# Patient Record
Sex: Female | Born: 1985 | Race: Black or African American | Hispanic: No | Smoking: Former smoker
Health system: Southern US, Community
[De-identification: ages and names within clinical notes are randomized; demographics above are authoritative.]

## PROBLEM LIST (undated history)

## (undated) DIAGNOSIS — Z789 Other specified health status: Secondary | ICD-10-CM

## (undated) HISTORY — PX: OTHER SURGICAL HISTORY: SHX169

---

## 2017-11-07 ENCOUNTER — Other Ambulatory Visit: Payer: Self-pay

## 2017-11-07 ENCOUNTER — Encounter: Payer: Self-pay | Admitting: Emergency Medicine

## 2017-11-07 ENCOUNTER — Ambulatory Visit
Admission: EM | Admit: 2017-11-07 | Discharge: 2017-11-07 | Disposition: A | Discharge status: Home / Self Care | Payer: No Typology Code available for payment source | Attending: Family Medicine | Admitting: Family Medicine

## 2017-11-07 DIAGNOSIS — K0889 Other specified disorders of teeth and supporting structures: Secondary | ICD-10-CM

## 2017-11-07 DIAGNOSIS — K047 Periapical abscess without sinus: Secondary | ICD-10-CM

## 2017-11-07 HISTORY — DX: Other specified health status: Z78.9

## 2017-11-07 MED ORDER — NAPROXEN 500 MG PO TABS: 500.0000 mg | ORAL_TABLET | Freq: Two times a day (BID) | ORAL | 0 refills | Status: DC

## 2017-11-07 MED ORDER — CEFTRIAXONE SODIUM 1 G IJ SOLR
1.0000 g | Freq: Once | INTRAMUSCULAR | Status: AC
  Administered 2017-11-07: 1 g via INTRAMUSCULAR

## 2017-11-07 MED ORDER — HYDROCODONE-ACETAMINOPHEN 5-325 MG PO TABS: 1.0000 | ORAL_TABLET | Freq: Four times a day (QID) | ORAL | 0 refills | Status: DC | PRN

## 2017-11-07 MED ORDER — AMOXICILLIN-POT CLAVULANATE 875-125 MG PO TABS: 1.0000 | ORAL_TABLET | Freq: Two times a day (BID) | ORAL | 0 refills | Status: DC

## 2017-11-07 MED ORDER — KETOROLAC TROMETHAMINE 60 MG/2ML IM SOLN
30.0000 mg | Freq: Once | INTRAMUSCULAR | Status: AC
  Administered 2017-11-07: 30 mg via INTRAMUSCULAR

## 2017-11-07 NOTE — Discharge Instructions (Signed)
Apply ice to your jaw he minutes out of every 2 hours 4-5 times daily for swelling and pain.  Arrange an appointment with a dentist for next week.  Her pain recommend one Naprosyn and 500 mg Tylenol taken together 6 hours with food.

## 2017-11-07 NOTE — ED Provider Notes (Signed)
[facility]    CSN: [id number] Arrival date & time: 11/07/17  1816     History   Chief Complaint Chief Complaint  Patient presents with  . tooth abscess    HPI Ashley Joseph is a 31 y.o. female.   HPI  31 year old female who presents with left swollen cheek and jaw that is extremely painful.  Has a fractured tooth on that side been progressively worsening over the last 3 days.  She has had no fever or chills but has a feeling of flu type symptoms.  Recently secured dental insurance and is going to make an appointment with a dentist tomorrow or the first of next week.        Past Medical History:  Diagnosis Date  . No known health problems     There are no active problems to display for this patient.   History reviewed. No pertinent surgical history.  OB History    No data available       Home Medications    Prior to Admission medications   Medication Sig Start Date End Date Taking? Authorizing Provider  amoxicillin-clavulanate (AUGMENTIN) 875-125 MG tablet Take 1 tablet by mouth every 12 (twelve) hours. 11/07/17   Lutricia Feiloemer, Nyah Shepherd P, PA-C  HYDROcodone-acetaminophen (NORCO/VICODIN) 5-325 MG tablet Take 1-2 tablets by mouth every 6 (six) hours as needed. 11/07/17   Lutricia Feiloemer, Shadaya Marschner P, PA-C  naproxen (NAPROSYN) 500 MG tablet Take 1 tablet (500 mg total) by mouth 2 (two) times daily. 11/07/17   Lutricia Feiloemer, Karine Garn P, PA-C    Family History Family History  Problem Relation Age of Onset  . Stroke Mother   . Hypertension Mother   . Hypertension Father   . Diabetes Maternal Aunt     Social History Social History   Tobacco Use  . Smoking status: Former Smoker    Last attempt to quit: 02/2017    Years since quitting: 0.7  . Smokeless tobacco: Never Used  Substance Use Topics  . Alcohol use: Yes    Comment: socially  . Drug use: No     Allergies   Patient has no known allergies.   Review of Systems Review of Systems  Constitutional:  Positive for activity change, appetite change and fatigue. Negative for chills and fever.  HENT: Positive for dental problem.   All other systems reviewed and are negative.    Physical Exam Triage Vital Signs ED Triage Vitals  Enc Vitals Group     BP 11/07/17 1838 109/78     Pulse Rate 11/07/17 1838 88     Resp 11/07/17 1838 16     Temp 11/07/17 1838 98.6 F (37 C)     Temp Source 11/07/17 1838 Oral     SpO2 11/07/17 1838 100 %     Weight 11/07/17 1837 124 lb (56.2 kg)     Height 11/07/17 1837 5\' 5"  (1.651 m)     Head Circumference --      Peak Flow --      Pain Score 11/07/17 1838 6     Pain Loc --      Pain Edu? --      Excl. in GC? --    No data found.  Updated Vital Signs BP 109/78 (BP Location: Right Arm)   Pulse 88   Temp 98.6 F (37 C) (Oral)   Resp 16   Ht 5\' 5"  (1.651 m)   Wt 124 lb (56.2 kg)   LMP 10/15/2017   SpO2 100%  BMI 20.63 kg/m   Visual Acuity Right Eye Distance:   Left Eye Distance:   Bilateral Distance:    Right Eye Near:   Left Eye Near:    Bilateral Near:     Physical Exam  Constitutional: She is oriented to person, place, and time. She appears well-developed and well-nourished. No distress.  HENT:  Head: Normocephalic.  Examination shows facial swelling on the left in the lower jaw area.  Is exquisitely tender externally.  No cervical adenopathy appreciated.  Examination of the gumline over the buccal surface it is very tender underneath a fractured tooth on the left lower jaw.  No fluctuance is seen.  Eyes: Pupils are equal, round, and reactive to light.  Neck: Normal range of motion.  Musculoskeletal: Normal range of motion.  Neurological: She is alert and oriented to person, place, and time.  Skin: Skin is warm and dry. She is not diaphoretic.  Psychiatric: She has a normal mood and affect. Her behavior is normal. Judgment and thought content normal.  Nursing note and vitals reviewed.    UC Treatments / Results  Labs (all  labs ordered are listed, but only abnormal results are displayed) Labs Reviewed - No data to display  EKG  EKG Interpretation None       Radiology No results found.  Procedures Procedures (including critical care time)  Medications Ordered in UC Medications  ketorolac (TORADOL) injection 30 mg (30 mg Intramuscular Given 11/07/17 1910)  cefTRIAXone (ROCEPHIN) injection 1 g (1 g Intramuscular Given 11/07/17 1909)     Initial Impression / Assessment and Plan / UC Course  I have reviewed the triage vital signs and the nursing notes.  Pertinent labs & imaging results that were available during my care of the patient were reviewed by me and considered in my medical decision making (see chart for details).     Plan: 1. Test/x-ray results and diagnosis reviewed with patient 2. rx as per orders; risks, benefits, potential side effects reviewed with patient 3. Recommend supportive treatment with ice applied to the cheek 20 minutes out of every 2 hours 4-5 times daily for swelling and pain.  Prescribe a few Vicodin because the patient is in a great deal of pain.  [place] substance abuse registry was accessed with no findings of any current opioid use.  Because of the amount of pain and swelling I did give her Rocephin 1 g IM today along with 30 mg of Toradol for pain.  She will begin the Augmentin tonight and complete the course of therapy.  She will also make an appointment with a dentist either tomorrow or on Monday for referral as soon as possible 4. F/u prn if symptoms worsen or don't improve   Final Clinical Impressions(s) / UC Diagnoses   Final diagnoses:  Abscessed tooth    ED Discharge Orders        Ordered    amoxicillin-clavulanate (AUGMENTIN) 875-125 MG tablet  Every 12 hours     11/07/17 1902    HYDROcodone-acetaminophen (NORCO/VICODIN) 5-325 MG tablet  Every 6 hours PRN     11/07/17 1902    naproxen (NAPROSYN) 500 MG tablet  2 times daily     11/07/17 1902         Controlled Substance Prescriptions [place] Controlled Substance Registry consulted? With [place] substance abuse registry was accessed no findings of her recent use of opioids.   Lutricia Feil, PA-C 11/07/17 1950

## 2017-11-07 NOTE — ED Triage Notes (Signed)
Patient in today c/o left sided facial swelling and pain from an abscessed tooth.

## 2017-12-04 ENCOUNTER — Telehealth: Payer: Self-pay

## 2017-12-04 NOTE — Telephone Encounter (Signed)
Asher MuirJamie from Rehab is calling, patient working on pre employment physical Patient had an abnormal EKG and prolonged QT syndrome Set up new patient appt 2/5 with Dr Mariah MillingGollan  Needs sooner, added to waitlist

## 2017-12-06 ENCOUNTER — Ambulatory Visit: Payer: Self-pay | Admitting: Medical

## 2017-12-06 ENCOUNTER — Encounter: Payer: Self-pay | Admitting: Medical

## 2017-12-06 DIAGNOSIS — M25562 Pain in left knee: Secondary | ICD-10-CM

## 2017-12-06 DIAGNOSIS — J019 Acute sinusitis, unspecified: Secondary | ICD-10-CM

## 2017-12-06 DIAGNOSIS — G8929 Other chronic pain: Secondary | ICD-10-CM

## 2017-12-06 NOTE — Progress Notes (Signed)
Subjective:    Patient ID: Ashley Joseph, female    DOB: Aug 11, 1986, 32 y.o.   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 Did check that she gets short of breath going up stairs to the 3rd floor, she lives on the 3rd floor. Also was exercising daily with play station dance doing 1-2 hours, would get short of breath after 3rd dance , but continued dancing. Since talking to Dr. Deforest Hoyles she has not exercised. Pending Cardiology appointment EKG showed non specific changes and prolonged QT scheduled  per Dr. Deforest Hoyles pending appointment on January 15th 2019. Received Hep B vaccine #1 today. VIS sheet given to patient.  Also history of  Left medial knee pain. History of fall in 11th grade x-rays done  denies any fracture per patient.Placed in knee immobilizer x 1 month. Five-six months ago had a bruise on medial side of left knee seen at [facility] Ultrasound done, patient said it was "fine". Sometimes gives her pain going up and down stairs.  Also has had yellow drainage starting today. History of post nasal drip. No primary doctor at this time, says last labs were 2 years ago and was told she was "fine". Moved from [place] in June 2018.  Denies any medical history including anemia . Denies any surgeries.   Social Married no children. Former smoker quit 2017 used to smoke 5 cigarettes/day x 5 years. Wears glasses all the time last eye exam 2017.   Review of Systems  Constitutional: Negative for chills and fever.  HENT: Positive for congestion, postnasal drip and sinus pressure. Negative for ear pain and sore throat.   Eyes: Negative for discharge and itching.  Respiratory: Negative for cough. Shortness of breath: see narrative.   Cardiovascular: Negative for chest pain and leg swelling.  Gastrointestinal: Negative for abdominal pain.  Endocrine: Negative for polydipsia, polyphagia and polyuria.   Genitourinary: Negative for dysuria.  Musculoskeletal: Negative for back pain.  Skin: Negative for rash.  Neurological: Positive for dizziness (2 weeks ago while dancing, sat down lasted only a minute, drank water and it resolved.) and headaches (forehead). Negative for syncope.  Hematological: Positive for adenopathy (prior to  having tooth pulled left lower about initially with abcessed tooth. Finished antibiotics 3 days ago.).       Objective:   Physical Exam  Constitutional: She is oriented to person, place, and time. She appears well-developed and well-nourished.  HENT:  Head: Normocephalic and atraumatic.  Right Ear: Hearing, external ear and ear canal normal.  Left Ear: Hearing, external ear and ear canal normal.  Mouth/Throat: Uvula is midline, oropharynx is clear and moist and mucous membranes are normal.  Eyes: Conjunctivae, EOM and lids are normal. Pupils are equal, round, and reactive to light.  Fundoscopic exam:      The right eye shows red reflex.       The left eye shows red reflex.  Neck: Normal range of motion. Neck supple.  Left submandibular  Cardiovascular: Normal rate, regular rhythm and intact distal pulses. Exam reveals friction rub. Exam reveals no gallop.  No murmur heard. Pulmonary/Chest: Effort normal and breath sounds normal. No respiratory distress. She has no wheezes. She has no rales.  Abdominal: Soft. Bowel sounds are normal. She exhibits no distension and no mass. There is no tenderness. There is no rebound and no guarding.  Musculoskeletal: Normal range of motion.  Lymphadenopathy:    She has cervical  adenopathy.  Neurological: She is alert and oriented to person, place, and time. She has normal reflexes.  Skin: Skin is warm and dry.  Psychiatric: She has a normal mood and affect. Her behavior is normal. Judgment and thought content normal.  Nursing note and vitals reviewed.  Cerumen obscuring both TMs. Inside right nare bloody lateral side of  right nare, green discharge noted and erythema. Inside left nare erythema. Left submandibular mild adenopathy noted. Pain on medial drawers test on the left knee and mild on lateral . None posterior or anterior. No pain on right knee.  Petite     Assessment & Plan:  Sinusitis Left knee pain.[facility]commended [facility] evaluation and clearance for left knee pain / sinusitis. Cardiology referral per Dr. Deforest Hoylesaubs January 15th. History of Shortness of breath. Abnormal labs will review with Dr. Sullivan LoneGilbert.  To get clearance letters before continuing [employer] paperwork for position. Discussed lab work with Dr. Sullivan LoneGilbert mild anemia to follow up with a family doctor.

## 2017-12-07 NOTE — Progress Notes (Signed)
Cardiology Office Note  Date:  12/10/2017   ID:  Ashley Joseph, DOB 20-Feb-1986, MRN [id number]  PCP:  Herbert SetaHeather Radcliffe/Dr. Adriana Simasook  Chief Complaint  Patient presents with  . other    Consultation for ABN EKG with prolonged QT. Meds reviewed verbally with pt.    HPI:  Ashley Joseph is a 32 year old woman with past medical history of Former smoker "elevated cholesterol" patient working on pre employment physical Who presents by referral from [employer] Radcliffe for  abnormal EKG and prolonged QT syndrome  She reports having preemployment physical  EKG with abnormality, prolonged QT  EKGs not available for review   5 days of sinus discharge Recent tooth pulled, completed her amoxicillin Nares are somewhat congested   reports having some mild shortness of breath with climbing flights of stairs Also mild shortness of breath when she does wii "just dance" after several songs Sits down to recover with no significant problem  Otherwise able to walk on flat surfaces for prolonged periods of time with no shortness of breath or chest pressure  EKG personally reviewed by myself on todays visit Shows normal sinus rhythm with rate 76 beats per minute no significant ST or T-wave changes, normal QTC 434   PMH:  Prior history of smoking, stopped one year ago  PSH:    Past Surgical History:  Procedure Laterality Date  . none      No current outpatient medications on file.   No current facility-administered medications for this visit.      Allergies:   Patient has no known allergies.   Social History:  The patient  reports that she quit smoking about 9 months ago. She quit after 6.00 years of use. she has never used smokeless tobacco. She reports that she drinks alcohol. She reports that she does not use drugs.   Family History:   family history includes Congestive Heart Failure in her maternal grandmother and mother; Diabetes in her maternal aunt; Heart failure in her mother;  Hypertension in her father and mother; Stroke in her maternal grandmother and mother.    Review of Systems: Review of Systems  Constitutional: Negative.   Respiratory: Negative.   Cardiovascular: Negative.   Gastrointestinal: Negative.   Musculoskeletal: Negative.   Neurological: Negative.   Psychiatric/Behavioral: Negative.   All other systems reviewed and are negative.    PHYSICAL EXAM: VS:  BP 120/84 (BP Location: Right Arm, Patient Position: Sitting, Cuff Size: Normal)   Pulse 76   Ht 5\' 5"  (1.651 m)   Wt 131 lb 4 oz (59.5 kg)   BMI 21.84 kg/m  , BMI Body mass index is 21.84 kg/m. GEN: Well nourished, well developed, in no acute distress  HEENT: normal  Neck: no JVD, carotid bruits, or masses Cardiac: RRR; no murmurs, rubs, or gallops,no edema  Respiratory:  clear to auscultation bilaterally, normal work of breathing GI: soft, nontender, nondistended, + BS MS: no deformity or atrophy  Skin: warm and dry, no rash Neuro:  Strength and sensation are intact Psych: euthymic mood, full affect    Recent Labs: No results found for requested labs within last 8760 hours.    Lipid Panel No results found for: CHOL, HDL, LDLCALC, TRIG    Wt Readings from Last 3 Encounters:  12/10/17 131 lb 4 oz (59.5 kg)  11/07/17 124 lb (56.2 kg)       ASSESSMENT AND PLAN:  Abnormal EKG - Plan: EKG 12-Lead Repeat EKG on today's visit is normal with no abnormalities, normal  QT Normal clinical exam, no further workup needed  Viral sinusitis Suspect viral sinus congestion She has completed amoxicillin after recent tooth extraction Does not need additional antibiotics at this time   Encounter for preventive care Reports having elevated cholesterol but numbers unavailable No immediate need for medical management at this time given her age  Prior history of smoking Stopped one year ago Continue smoking cessation recommended  Disposition:   F/U as needed    patient was seen  in consultation for Benjamin Stain will be referred back to her office for ongoing care of the issues detailed above   Total encounter time more than 60 minutes  Greater than 50% was spent in counseling and coordination of care with the patient   Orders Placed This Encounter  Procedures  . EKG 12-Lead     Signed, Dossie Arbour, M.D., Ph.D. 12/10/2017  [facility] [place], [place] [phone]

## 2017-12-10 ENCOUNTER — Encounter: Payer: Self-pay | Admitting: Cardiovascular Disease

## 2017-12-10 ENCOUNTER — Ambulatory Visit (INDEPENDENT_AMBULATORY_CARE_PROVIDER_SITE_OTHER): Payer: No Typology Code available for payment source | Admitting: Cardiovascular Disease

## 2017-12-10 ENCOUNTER — Encounter: Payer: Self-pay | Admitting: *Deleted

## 2017-12-10 VITALS — BP 120/84 | HR 76 | Ht 65.0 in | Wt 131.2 lb

## 2017-12-10 DIAGNOSIS — J329 Chronic sinusitis, unspecified: Secondary | ICD-10-CM | POA: Diagnosis not present

## 2017-12-10 DIAGNOSIS — B9789 Other viral agents as the cause of diseases classified elsewhere: Secondary | ICD-10-CM | POA: Diagnosis not present

## 2017-12-10 DIAGNOSIS — R9431 Abnormal electrocardiogram [ECG] [EKG]: Secondary | ICD-10-CM

## 2017-12-10 DIAGNOSIS — Z0000 Encounter for general adult medical examination without abnormal findings: Secondary | ICD-10-CM | POA: Diagnosis not present

## 2017-12-10 DIAGNOSIS — Z87891 Personal history of nicotine dependence: Secondary | ICD-10-CM

## 2017-12-10 NOTE — Patient Instructions (Signed)
Medication Instructions:   No medication changes made  Labwork:  No new labs needed  Testing/Procedures:  No further testing at this time   Follow-Up: It was a pleasure seeing you in the office today. Please call us if you have new issues that need to be addressed before your next appt.  [phone]  Your physician wants you to follow-up in:  As needed  If you need a refill on your cardiac medications before your next appointment, please call your pharmacy.     

## 2017-12-11 ENCOUNTER — Encounter: Payer: Self-pay | Admitting: Cardiovascular Disease

## 2017-12-25 ENCOUNTER — Ambulatory Visit
Admission: EM | Admit: 2017-12-25 | Discharge: 2017-12-25 | Disposition: A | Discharge status: Home / Self Care | Payer: No Typology Code available for payment source | Attending: Family Medicine | Admitting: Family Medicine

## 2017-12-25 ENCOUNTER — Other Ambulatory Visit: Payer: Self-pay

## 2017-12-25 DIAGNOSIS — J069 Acute upper respiratory infection, unspecified: Secondary | ICD-10-CM

## 2017-12-25 DIAGNOSIS — R05 Cough: Secondary | ICD-10-CM | POA: Diagnosis not present

## 2017-12-25 DIAGNOSIS — B9789 Other viral agents as the cause of diseases classified elsewhere: Secondary | ICD-10-CM | POA: Diagnosis not present

## 2017-12-25 DIAGNOSIS — T161XXA Foreign body in right ear, initial encounter: Secondary | ICD-10-CM

## 2017-12-25 MED ORDER — FLUTICASONE PROPIONATE 50 MCG/ACT NA SUSP: 1.0000 | Freq: Every day | NASAL | 0 refills | Status: DC

## 2017-12-25 MED ORDER — BENZONATATE 100 MG PO CAPS: 100.0000 mg | ORAL_CAPSULE | Freq: Three times a day (TID) | ORAL | 0 refills | Status: DC | PRN

## 2017-12-25 MED ORDER — HYDROCOD POLST-CPM POLST ER 10-8 MG/5ML PO SUER: 5.0000 mL | Freq: Every evening | ORAL | 0 refills | Status: DC | PRN

## 2017-12-25 NOTE — Discharge Instructions (Signed)
Take medication as prescribed. Rest. Drink plenty of fluids. Avoid ear plugs as discussed.   Follow up with your primary care physician this week as needed. Return to Urgent care for new or worsening concerns.

## 2017-12-25 NOTE — ED Triage Notes (Signed)
Pt reports part of an earplug broke off in her right ear today. Mild discomfort. Also complains of a cough.

## 2017-12-25 NOTE — ED Provider Notes (Signed)
[facility] ____________________________________________  Time seen: Approximately 653 PM  I have reviewed the triage vital signs and the nursing notes.   HISTORY  Chief Complaint Foreign Body in Ear  HPI Ashley HeinzJasmine Joseph is a 32 y.o. female presenting for evaluation of removal of broken piece of ear plug in her right ear.  Patient states that she sleeps in earplugs due to her husband's snoring, and states that part of her ear plug broke off in her right ear today.  States mild discomfort to the right ear.  Also reports she has had 3-4 days of runny nose, nasal congestion and cough.  Denies sore throat, fevers, chest pain, shortness of breath, recent sickness.  Reports her husband recently sick with some similar complaints.  States did take some over-the-counter congestion medication this morning with much change.  States cough disrupt her sleep today.  Denies accompanying fevers.  Reports otherwise feels well.  States that she tried to use water to flush out right ear plugged, unsuccessfully. Denies recent sickness. Denies recent antibiotic use.   Patient's last menstrual period was 11/28/2017.Denies pregnancy.    Past Medical History:  Diagnosis Date  . No known health problems     Patient Active Problem List   Diagnosis Date Noted  . Abnormal EKG 12/10/2017  . Viral sinusitis 12/10/2017  . Encounter for preventive care 12/10/2017  . History of smoking 12/10/2017    Past Surgical History:  Procedure Laterality Date  . none       No current facility-administered medications for this encounter.   Current Outpatient Medications:  .  benzonatate (TESSALON PERLES) 100 MG capsule, Take 1 capsule (100 mg total) by mouth 3 (three) times daily as needed for cough., Disp: 15 capsule, Rfl: 0 .  chlorpheniramine-HYDROcodone (TUSSIONEX PENNKINETIC ER) 10-8 MG/5ML SUER, Take 5 mLs by mouth at bedtime as needed for cough. do not drive or operate machinery while taking as can  cause drowsiness., Disp: 50 mL, Rfl: 0 .  fluticasone (FLONASE) 50 MCG/ACT nasal spray, Place 1 spray into both nostrils daily., Disp: 1 g, Rfl: 0  Allergies Patient has no known allergies.  Family History  Problem Relation Age of Onset  . Stroke Mother   . Hypertension Mother   . Congestive Heart Failure Mother   . Heart failure Mother   . Hypertension Father   . Diabetes Maternal Aunt   . Congestive Heart Failure Maternal Grandmother   . Stroke Maternal Grandmother     Social History Social History   Tobacco Use  . Smoking status: Former Smoker    Years: 6.00    Types: Cigars    Last attempt to quit: 02/2017    Years since quitting: 0.8  . Smokeless tobacco: Never Used  . Tobacco comment: Black mild 5-6 per day  Substance Use Topics  . Alcohol use: Yes    Comment: socially  . Drug use: No    Review of Systems Constitutional: No fever/chills Eyes: No visual changes. ENT: No sore throat.As above.  Cardiovascular: Denies chest pain. Respiratory: Denies shortness of breath. Gastrointestinal: No abdominal pain.  Skin: Negative for rash.  ____________________________________________   PHYSICAL EXAM:  VITAL SIGNS: ED Triage Vitals  Enc Vitals Group     BP 12/25/17 1810 115/77     Pulse Rate 12/25/17 1810 87     Resp 12/25/17 1810 18     Temp 12/25/17 1810 98.8 F (37.1 C)     Temp Source 12/25/17 1810 Oral  SpO2 12/25/17 1810 100 %     Weight 12/25/17 1811 132 lb (59.9 kg)     Height 12/25/17 1811 5\' 5"  (1.651 m)     Head Circumference --      Peak Flow --      Pain Score 12/25/17 1811 1     Pain Loc --      Pain Edu? --      Excl. in GC? --     Constitutional: Alert and oriented. Well appearing and in no acute distress. Eyes: Conjunctivae are normal.  Head: Atraumatic. No sinus tenderness to palpation. No swelling. No erythema.  Ears: Left: Nontender, mild cerumen present in canal, otherwise normal canal, no erythema, normal TM.  Right:  Nontender, bright yellow foreign body present in ear canal, post foreign body removal, canal with minimal inflammation, mild cerumen present, no erythema normal TM.  Nose:Nasal congestion with clear rhinorrhea  Mouth/Throat: Mucous membranes are moist. No pharyngeal erythema. No tonsillar swelling or exudate.  Neck: No stridor.  No cervical spine tenderness to palpation. Hematological/Lymphatic/Immunilogical: No cervical lymphadenopathy. Cardiovascular: Normal rate, regular rhythm. Grossly normal heart sounds.  Good peripheral circulation. Respiratory: Normal respiratory effort.  No retractions. No wheezes, rales or rhonchi. Good air movement.  Dry intermittent cough noted in room. Musculoskeletal: Ambulatory with steady gait. No cervical, thoracic or lumbar tenderness to palpation. Neurologic:  Normal speech and language. No gait instability. Skin:  Skin appears warm, dry and intact. No rash noted. Psychiatric: Mood and affect are normal. Speech and behavior are normal.  ___________________________________________   LABS (all labs ordered are listed, but only abnormal results are displayed)  Labs Reviewed - No data to display ____________________________________________   PROCEDURES Procedures    Right ear foreign body removal. Procedure explained and verbal consent obtained. Alligator forceps used to secure withdrawal foreign body present.  Patient tolerated well.  Foreign body removed.  INITIAL IMPRESSION / ASSESSMENT AND PLAN / ED COURSE  Pertinent labs & imaging results that were available during my care of the patient were reviewed by me and considered in my medical decision making (see chart for details).  Well-appearing patient.  No acute distress.  Right ear foreign body removed, patient tolerated well.  Suspect viral upper respiratory infection.  Discussed supportive care.  Flonase, PRN Tessalon Perles and as needed Tussionex.  Discussed over-the-counter Sudafed.   Encouraged to not use earplugs at this time.  Encouraged rest, fluids, supportive care.Discussed indication, risks and benefits of medications with patient.  Discussed follow up with Primary care physician this week. Discussed follow up and return parameters including no resolution or any worsening concerns. Patient verbalized understanding and agreed to plan.   ____________________________________________   FINAL CLINICAL IMPRESSION(S) / ED DIAGNOSES  Final diagnoses:  Acute foreign body of right ear, initial encounter  Viral URI with cough     ED Discharge Orders        Ordered    benzonatate (TESSALON PERLES) 100 MG capsule  3 times daily PRN     12/25/17 1909    chlorpheniramine-HYDROcodone (TUSSIONEX PENNKINETIC ER) 10-8 MG/5ML SUER  At bedtime PRN     12/25/17 1909    fluticasone (FLONASE) 50 MCG/ACT nasal spray  Daily     12/25/17 1909       Note: This dictation was prepared with Dragon dictation along with smaller phrase technology. Any transcriptional errors that result from this process are unintentional.         Renford Dills, NP 12/25/17 2100

## 2017-12-28 ENCOUNTER — Telehealth: Payer: Self-pay

## 2017-12-28 NOTE — Telephone Encounter (Signed)
Called to follow up with patient since visit here at [facility]. Patient instructed to call back with any questions or concerns. MAH  

## 2017-12-31 ENCOUNTER — Ambulatory Visit: Payer: Self-pay | Admitting: Cardiovascular Disease

## 2018-01-02 ENCOUNTER — Other Ambulatory Visit: Payer: Self-pay

## 2018-01-02 ENCOUNTER — Ambulatory Visit (INDEPENDENT_AMBULATORY_CARE_PROVIDER_SITE_OTHER): Payer: No Typology Code available for payment source | Admitting: Nurse Practitioner

## 2018-01-02 ENCOUNTER — Encounter: Payer: Self-pay | Admitting: Nurse Practitioner

## 2018-01-02 VITALS — BP 105/67 | HR 79 | Temp 99.1°F | Resp 16 | Ht 65.0 in | Wt 134.0 lb

## 2018-01-02 DIAGNOSIS — J208 Acute bronchitis due to other specified organisms: Secondary | ICD-10-CM

## 2018-01-02 DIAGNOSIS — K625 Hemorrhage of anus and rectum: Secondary | ICD-10-CM

## 2018-01-02 DIAGNOSIS — B9789 Other viral agents as the cause of diseases classified elsewhere: Secondary | ICD-10-CM

## 2018-01-02 DIAGNOSIS — Z7689 Persons encountering health services in other specified circumstances: Secondary | ICD-10-CM

## 2018-01-02 DIAGNOSIS — J329 Chronic sinusitis, unspecified: Secondary | ICD-10-CM

## 2018-01-02 DIAGNOSIS — M25562 Pain in left knee: Secondary | ICD-10-CM

## 2018-01-02 DIAGNOSIS — G8929 Other chronic pain: Secondary | ICD-10-CM | POA: Insufficient documentation

## 2018-01-02 NOTE — Progress Notes (Signed)
Subjective:    Patient ID: Ashley HeinzJasmine Joseph, female    DOB: 08-09-1986, 32 y.o.   MRN: [id number]  Ashley Joseph is a 32 y.o. female presenting on 01/02/2018 for Establish Care (difficulty hearing out of right ear. The pt was seen at the urgent care x 1 week ago, because she had a piece of a ear plug stuck in her R ear. )   HPI Establish Care New Provider Pt last seen by PCP/pediatrician - Dr. Signe Coltover [place], [place] [facility] 2-3 years ago.  [facility] and Dr. Kearney Hardover for immunizations.   Right ear: decreased hearing, pain Ear plug stuck in ear 1 week ago and pt reports continuing to have pain since then.  Pt has also had a sinus infection and was on amoxicillin for tooth abscess, so Urgent Care didn't add any other antibiotics.  After extraction of bottom left mouth started having symptoms of URI 2-3 days later.  Tooth extraction occurred 14 days ago with URI symptoms x 10 days.  Rectal bleeding:  - Notes when wipes on tissue after defecation.  Much less now than 2 weeks ago.  No BM are normal.  3 days ago, BM began to have normal consistency.  Did have blood in toilet water 2 weeks ago, but notes no blood in toilet water now.  Now, pt notes her stool is light orange color and no maroon/black.  Denies abdominal pain, rectal pain.  Past Medical History:  Diagnosis Date  . No known health problems    Past Surgical History:  Procedure Laterality Date  . none     Social History   Socioeconomic History  . Marital status: Married    Spouse name: Not on file  . Number of children: 0  . Years of education: Not on file  . Highest education level: High school graduate  Social Needs  . Financial resource strain: Not on file  . Food insecurity - worry: Not on file  . Food insecurity - inability: Not on file  . Transportation needs - medical: Not on file  . Transportation needs - non-medical: Not on file  Occupational History  . Not on file  Tobacco  Use  . Smoking status: Former Smoker    Years: 6.00    Types: Cigars    Last attempt to quit: 02/2017    Years since quitting: 0.9  . Smokeless tobacco: Never Used  . Tobacco comment: Black mild 5-6 per day  Substance and Sexual Activity  . Alcohol use: Yes    Comment: socially  . Drug use: No  . Sexual activity: Yes    Birth control/protection: None  Other Topics Concern  . Not on file  Social History Narrative  . Not on file   Family History  Problem Relation Age of Onset  . Stroke Mother   . Hypertension Mother   . Congestive Heart Failure Mother   . Heart failure Mother   . Hypertension Father   . ALS Father   . Diabetes Maternal Aunt   . Congestive Heart Failure Maternal Grandmother   . Stroke Maternal Grandmother   . Healthy Sister   . Other Brother        surgical removal of tumor affecting optic nerve  . Healthy Sister   . Healthy Brother    Current Outpatient Medications on File Prior to Visit  Medication Sig  . benzonatate (TESSALON PERLES) 100 MG capsule Take 1 capsule (100 mg total) by mouth  3 (three) times daily as needed for cough.  . fluticasone (FLONASE) 50 MCG/ACT nasal spray Place 1 spray into both nostrils daily.  . chlorpheniramine-HYDROcodone (TUSSIONEX PENNKINETIC ER) 10-8 MG/5ML SUER Take 5 mLs by mouth at bedtime as needed for cough. do not drive or operate machinery while taking as can cause drowsiness. (Patient not taking: Reported on 01/02/2018)   No current facility-administered medications on file prior to visit.     Review of Systems  Constitutional: Negative.   HENT: Positive for ear pain, sinus pressure and sinus pain.   Eyes: Negative.   Respiratory: Positive for cough.   Cardiovascular: Negative.   Gastrointestinal: Positive for blood in stool.  Endocrine: Negative.   Genitourinary: Negative.   Musculoskeletal: Positive for arthralgias.  Skin: Negative.   Allergic/Immunologic: Negative.   Neurological: Negative.     Hematological: Negative.   Psychiatric/Behavioral: Negative.    Per HPI unless specifically indicated above     Objective:    BP 105/67 (BP Location: Right Arm, Patient Position: Sitting, Cuff Size: Small)   Pulse 79   Temp 99.1 F (37.3 C) (Oral)   Resp 16   Ht 5\' 5"  (1.651 m)   Wt 134 lb (60.8 kg)   LMP 12/30/2017   BMI 22.30 kg/m   Wt Readings from Last 3 Encounters:  01/02/18 134 lb (60.8 kg)  12/25/17 132 lb (59.9 kg)  12/10/17 131 lb 4 oz (59.5 kg)    Physical Exam  Constitutional: She is oriented to person, place, and time. She appears well-developed and well-nourished. She appears distressed (mildly).  HENT:  Head: Normocephalic and atraumatic.  Right Ear: Hearing, tympanic membrane, external ear and ear canal normal.  Left Ear: Hearing, tympanic membrane, external ear and ear canal normal.  Nose: Mucosal edema and rhinorrhea present. Right sinus exhibits maxillary sinus tenderness and frontal sinus tenderness. Left sinus exhibits maxillary sinus tenderness and frontal sinus tenderness.  Mouth/Throat: Uvula is midline and mucous membranes are normal. Posterior oropharyngeal edema (cobblestoning) present. No oropharyngeal exudate (clear secretions), posterior oropharyngeal erythema or tonsillar abscesses.  Neck: Normal range of motion. Neck supple.  Cardiovascular: Normal rate, regular rhythm, S1 normal, S2 normal, normal heart sounds and intact distal pulses.  Pulmonary/Chest: Effort normal and breath sounds normal. No respiratory distress.  Abdominal: Soft. Bowel sounds are normal. She exhibits no distension and no mass. There is no tenderness.  Lymphadenopathy:    She has cervical adenopathy.  Neurological: She is alert and oriented to person, place, and time.  Skin: Skin is warm and dry.  Psychiatric: She has a normal mood and affect. Her behavior is normal.  Vitals reviewed.       Assessment & Plan:   Problem List Items Addressed This Visit    None     Visit Diagnoses    Viral bronchitis    -  Primary Acute illness. Fever responsive to NSAIDs and tylenol.  Symptoms not worsening. Consistent with viral illness x 10 days with no known sick contacts and no identifiable focal infections of ears, nose, throat. - Clinical presentation c/w bronchitis  Plan: 1. Reassurance, likely self-limited with cough lasting up to few weeks - Start Atrovent nasal spray decongestant 2 sprays each nostril up to 4 times daily for 5-7 days - Start anti-histamine Cetirizine 10mg  daily,  - also can use Flonase 2 sprays each nostril daily for up to 4-6 weeks - Start Mucinex-DM OTC up to 7-10 days then stop - Start tessalon perles 100 mg every 12 hours  as needed for cough. 2. Supportive care with nasal saline, warm herbal tea with honey, 3. Improve hydration 4. Tylenol / Motrin PRN fevers 5. Return criteria given    Rectal bleeding     Likely self limited and related to fissure or hemorrhoid.  Pt declined rectal exam today.  Bleeding is resolving.  Stool color not consistent with continued bleeding.  Plan: 1. Recheck CBC ensure no anemia or chronic bleeding. 2. Continue to monitor symptoms and return if bleeding returns.   Relevant Orders   CBC with Differential/Platelet   Encounter to establish care     Pt's PCP care was > 5 years ago at Pediatrics.  Past medical, family, and surgical history reviewed w/ pt.         Follow up plan: Return in about 3 months (around 04/01/2018), or 5-7 days if symptoms worsen or fail to improve, for Annual physical.  Wilhelmina Mcardle, DNP, AGPCNP-BC Adult Gerontology Primary Care Nurse Practitioner [facility] 01/22/2018, 9:00 PM

## 2018-01-02 NOTE — Patient Instructions (Addendum)
Ashley Joseph, Thank you for coming in to clinic today.  1. It sounds like you have a Upper Respiratory Virus - this will most likely run it's course in 7 to 10 days. Recommend good hand washing. - Continue Flonase 2 sprays each nostril daily for up to 4-6 weeks - If congestion is worse, start OTC Mucinex (or may try Mucinex-DM for cough) up to 7-10 days then stop - benzonatate - continue as previously prescribed. - Drink plenty of fluids to improve congestion - You may try over the counter Nasal Saline spray (Simply Saline, Ocean Spray) as needed to reduce congestion. - Drink warm herbal tea with honey for sore throat. - Start taking Tylenol extra strength 1 to 2 tablets every 6-8 hours for aches or fever/chills for next few days as needed.  Do not take more than 3,000 mg in 24 hours from all medicines.  May take Ibuprofen as well if tolerated 200-400mg  every 8 hours as needed.  If symptoms significantly worsening with persistent fevers/chills despite tylenol/ibpurofen, nausea, vomiting unable to tolerate food/fluids or medicine, body aches, or shortness of breath, sinus pain pressure or worsening productive cough, then follow-up for re-evaluation, may seek more immediate care at Urgent Care or ED if more concerned for emergency.  2.  Monitor your blood in your stool.  There are several possible causes including hemorrhoids or a GI bleed.  If you have any more blood in the toilet or any blood on your tissue after the next 7 days, call clinic.  Please schedule a follow-up appointment with Wilhelmina McardleLauren Edythe Riches, AGNP. Return 5-7 days if symptoms worsen or fail to improve.  AND for annual physical in next 2-3 months.  If you have any other questions or concerns, please feel free to call the clinic or send a message through MyChart. You may also schedule an earlier appointment if necessary.  You will receive a survey after today's visit either digitally by e-mail or paper by [employer] mail. Your experiences and  feedback matter to us.  Please respond so we know how we are doing as we provide care for you.   Wilhelmina McardleLauren Benney [place], DNP, AGNP-BC Adult Gerontology Nurse Practitioner [facility], [facility]

## 2018-01-06 ENCOUNTER — Other Ambulatory Visit (INDEPENDENT_AMBULATORY_CARE_PROVIDER_SITE_OTHER): Payer: No Typology Code available for payment source

## 2018-01-06 DIAGNOSIS — Z1211 Encounter for screening for malignant neoplasm of colon: Secondary | ICD-10-CM | POA: Diagnosis not present

## 2018-01-06 LAB — HEMOCCULT GUIAC POC 1CARD (OFFICE)
Card #2 Fecal Occult Blod, POC: NEGATIVE
Card #3 Fecal Occult Blood, POC: NEGATIVE
Fecal Occult Blood, POC: NEGATIVE

## 2018-04-02 ENCOUNTER — Encounter: Payer: Self-pay | Admitting: Nurse Practitioner

## 2018-04-22 ENCOUNTER — Encounter: Payer: Self-pay | Admitting: Nurse Practitioner

## 2018-04-23 ENCOUNTER — Encounter: Payer: No Typology Code available for payment source | Admitting: Nurse Practitioner

## 2018-05-31 ENCOUNTER — Emergency Department

## 2018-05-31 ENCOUNTER — Emergency Department
Admission: EM | Admit: 2018-05-31 | Discharge: 2018-05-31 | Disposition: A | Discharge status: Home / Self Care | Attending: Emergency Medicine | Admitting: Emergency Medicine

## 2018-05-31 ENCOUNTER — Encounter: Payer: Self-pay | Admitting: Emergency Medicine

## 2018-05-31 DIAGNOSIS — Z23 Encounter for immunization: Secondary | ICD-10-CM | POA: Insufficient documentation

## 2018-05-31 DIAGNOSIS — Z87891 Personal history of nicotine dependence: Secondary | ICD-10-CM | POA: Insufficient documentation

## 2018-05-31 DIAGNOSIS — Z79899 Other long term (current) drug therapy: Secondary | ICD-10-CM | POA: Diagnosis not present

## 2018-05-31 DIAGNOSIS — S80211A Abrasion, right knee, initial encounter: Secondary | ICD-10-CM | POA: Insufficient documentation

## 2018-05-31 DIAGNOSIS — Y929 Unspecified place or not applicable: Secondary | ICD-10-CM | POA: Diagnosis not present

## 2018-05-31 DIAGNOSIS — W540XXA Bitten by dog, initial encounter: Secondary | ICD-10-CM | POA: Diagnosis not present

## 2018-05-31 DIAGNOSIS — T148XXA Other injury of unspecified body region, initial encounter: Secondary | ICD-10-CM

## 2018-05-31 DIAGNOSIS — S8991XA Unspecified injury of right lower leg, initial encounter: Secondary | ICD-10-CM | POA: Diagnosis present

## 2018-05-31 DIAGNOSIS — Y9389 Activity, other specified: Secondary | ICD-10-CM | POA: Diagnosis not present

## 2018-05-31 DIAGNOSIS — Y990 Civilian activity done for income or pay: Secondary | ICD-10-CM | POA: Diagnosis not present

## 2018-05-31 MED ORDER — AMOXICILLIN-POT CLAVULANATE 875-125 MG PO TABS: 1.0000 | ORAL_TABLET | Freq: Two times a day (BID) | ORAL | 0 refills | Status: DC

## 2018-05-31 MED ORDER — RABIES IMMUNE GLOBULIN 150 UNIT/ML IM INJ
20.0000 [IU]/kg | INJECTION | Freq: Once | INTRAMUSCULAR | Status: AC
  Administered 2018-05-31: 1200 [IU] via INTRAMUSCULAR
  Filled 2018-05-31: qty 8

## 2018-05-31 MED ORDER — OXYCODONE-ACETAMINOPHEN 5-325 MG PO TABS
1.0000 | ORAL_TABLET | Freq: Once | ORAL | Status: AC
  Administered 2018-05-31: 1 via ORAL
  Filled 2018-05-31: qty 1

## 2018-05-31 MED ORDER — AMOXICILLIN-POT CLAVULANATE 875-125 MG PO TABS
1.0000 | ORAL_TABLET | Freq: Once | ORAL | Status: AC
  Administered 2018-05-31: 1 via ORAL
  Filled 2018-05-31: qty 1

## 2018-05-31 MED ORDER — TETANUS-DIPHTH-ACELL PERTUSSIS 5-2.5-18.5 LF-MCG/0.5 IM SUSP
0.5000 mL | Freq: Once | INTRAMUSCULAR | Status: AC
  Administered 2018-05-31: 0.5 mL via INTRAMUSCULAR
  Filled 2018-05-31: qty 0.5

## 2018-05-31 MED ORDER — RABIES VACCINE, PCEC IM SUSR
1.0000 mL | Freq: Once | INTRAMUSCULAR | Status: AC
  Administered 2018-05-31: 1 mL via INTRAMUSCULAR
  Filled 2018-05-31: qty 1

## 2018-05-31 MED ORDER — HYDROCODONE-ACETAMINOPHEN 5-325 MG PO TABS: 1.0000 | ORAL_TABLET | ORAL | 0 refills | Status: DC | PRN

## 2018-05-31 NOTE — ED Provider Notes (Signed)
[facility] Emergency Department Provider Note  ____________________________________________  Time seen: Approximately 7:22 PM  I have reviewed the triage vital signs and the nursing notes.   HISTORY  Chief Complaint Animal Bite    HPI Ashley Joseph is a 32 y.o. female who presents the emergency department complaining of dog bite to the right knee.  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  Patient is unsure of the vaccination status of the animal.  Patient is unsure of her of her last tetanus shot.  Patient sustained a solitary bite to the medial knee.  Patient is having pain with any weightbearing.  No numbness or tingling distally.  No other bites or wounds.  No medications for this complaint prior to arrival.    Past Medical History:  Diagnosis Date  . No known health problems     Patient Active Problem List   Diagnosis Date Noted  . Chronic pain of left knee 01/02/2018  . History of smoking 12/10/2017    Past Surgical History:  Procedure Laterality Date  . none      Prior to Admission medications   Medication Sig Start Date End Date Taking? Authorizing Provider  amoxicillin-clavulanate (AUGMENTIN) 875-125 MG tablet Take 1 tablet by mouth 2 (two) times daily. 05/31/18   Destaney Sarkis, Delorise Royals, PA-C  benzonatate (TESSALON PERLES) 100 MG capsule Take 1 capsule (100 mg total) by mouth 3 (three) times daily as needed for cough. 12/25/17   Renford Dills, NP  chlorpheniramine-HYDROcodone [facility] PENNKINETIC ER) 10-8 MG/5ML SUER Take 5 mLs by mouth at bedtime as needed for cough. do not drive or operate machinery while taking as can cause drowsiness. Patient not taking: Reported on 01/02/2018 12/25/17   Renford Dills, NP  fluticasone [facility]) 50 MCG/ACT nasal spray Place 1 spray into both nostrils daily. 12/25/17   Renford Dills, NP   HYDROcodone-acetaminophen (NORCO/VICODIN) 5-325 MG tablet Take 1 tablet by mouth every 4 (four) hours as needed for moderate pain. 05/31/18   Juley Giovanetti, Delorise Royals, PA-C    Allergies Patient has no known allergies.  Family History  Problem Relation Age of Onset  . Stroke Mother   . Hypertension Mother   . Congestive Heart Failure Mother   . Heart failure Mother   . Hypertension Father   . ALS Father   . Diabetes Maternal Aunt   . Congestive Heart Failure Maternal Grandmother   . Stroke Maternal Grandmother   . Healthy Sister   . Other Brother        surgical removal of tumor affecting optic nerve  . Healthy Sister   . Healthy Brother     Social History Social History   Tobacco Use  . Smoking status: Former Smoker    Years: 6.00    Types: Cigars    Last attempt to quit: 02/2017    Years since quitting: 1.2  . Smokeless tobacco: Never Used  . Tobacco comment: Black mild 5-6 per day  Substance Use Topics  . Alcohol use: Yes    Comment: socially  . Drug use: No     Review of Systems  Constitutional: No fever/chills Eyes: No visual changes. No discharge ENT: No upper respiratory complaints. Cardiovascular: no chest pain. Respiratory: no cough. No SOB. Gastrointestinal: No abdominal pain.  No nausea, no vomiting.  Musculoskeletal: Dog bite to the right medial knee Skin: Negative for rash, abrasions, lacerations, ecchymosis.  Neurological: Negative for headaches, focal weakness or numbness. 10-point ROS otherwise negative.  ____________________________________________   PHYSICAL EXAM:  VITAL SIGNS: ED Triage Vitals  Enc Vitals Group     BP 05/31/18 1836 115/67     Pulse Rate 05/31/18 1836 93     Resp 05/31/18 1836 20     Temp 05/31/18 1836 99.4 F (37.4 C)     Temp Source 05/31/18 1836 Oral     SpO2 05/31/18 1836 100 %     Weight 05/31/18 1835 135 lb (61.2 kg)     Height 05/31/18 1835 5\' 5"  (1.651 m)     Head Circumference --      Peak Flow --       Pain Score 05/31/18 1843 10     Pain Loc --      Pain Edu? --      Excl. in GC? --      Constitutional: Alert and oriented. Well appearing and in no acute distress. Eyes: Conjunctivae are normal. PERRL. EOMI. Head: Atraumatic. Neck: No stridor.    Cardiovascular: Normal rate, regular rhythm. Normal S1 and S2.  Good peripheral circulation. Respiratory: Normal respiratory effort without tachypnea or retractions. Lungs CTAB. Good air entry to the bases with no decreased or absent breath sounds. Musculoskeletal: Full range of motion to all extremities. No gross deformities appreciated.  Utilization of the right knee reveals findings consistent with dog bite to the medial aspect of the knee.  1 linear tear measuring approximately 1.5 cm with 4 surrounding small punctate lesions.  No active bleeding.  No visible foreign body.  With coaxing, patient has good extension and flexion of the knee. Neurologic:  Normal speech and language. No gross focal neurologic deficits are appreciated.  Skin:  Skin is warm, dry and intact. No rash noted. Psychiatric: Mood and affect are normal. Speech and behavior are normal. Patient exhibits appropriate insight and judgement.   ____________________________________________   LABS (all labs ordered are listed, but only abnormal results are displayed)  Labs Reviewed - No data to display ____________________________________________  EKG   ____________________________________________  RADIOLOGY I personally viewed and evaluated these images as part of my medical decision making, as well as reviewing the written report by the radiologist.  I concur with radiologist and no retained foreign body.  Area of soft tissue gas is correlated with linear tear in the left medial knee.  Dg Knee Complete 4 Views Right  Result Date: 05/31/2018 CLINICAL DATA:  Attacked by a pit bull, bite marks RIGHT knee, medial RIGHT knee pain EXAM: RIGHT KNEE - COMPLETE 4+ VIEW COMPARISON:   None FINDINGS: Medial soft tissue swelling RIGHT knee. Small focus of soft tissue gas medially at the level of the medial tibial plateau. Osseous mineralization normal. Joint spaces preserved. No acute fracture, dislocation, or bone destruction. No knee joint effusion or radiopaque foreign bodies. IMPRESSION: No acute osseous abnormalities. Single focus of soft tissue gas at medial aspect of RIGHT knee. Electronically Signed   By: Ulyses Southward M.D.   On: 05/31/2018 19:47    ____________________________________________    PROCEDURES  Procedure(s) performed:    Procedures    Medications  rabies vaccine (RABAVERT) injection 1 mL (has no administration in time range)  rabies immune globulin (HYPERAB/KEDRAB) injection 1,200 Units (has no administration in time range)  oxyCODONE-acetaminophen (PERCOCET/ROXICET) 5-325 MG per tablet 1 tablet (1 tablet Oral Given 05/31/18 1940)  Tdap (BOOSTRIX) injection 0.5 mL (0.5 mLs Intramuscular Given 05/31/18 1941)  amoxicillin-clavulanate (AUGMENTIN) 875-125  MG per tablet 1 tablet (1 tablet Oral Given 05/31/18 1940)     ____________________________________________   INITIAL IMPRESSION / ASSESSMENT AND PLAN / ED COURSE  Pertinent labs & imaging results that were available during my care of the patient were reviewed by me and considered in my medical decision making (see chart for details).  Review of the [place] CSRS was performed in accordance of the [facility] prior to dispensing any controlled drugs.      Patient's diagnosis is consistent with dog bite.  Patient presents the emergency department from work after sustaining a dog bite.  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  Wound is thoroughly cleansed, dressed in the emergency department.  Patient is given a tetanus shot and started on antibiotics.  After discussion regarding rabies, rabies vaccination is requested an initial administration along with immunoglobin is administered in the  emergency department.  Patient is aware that she needs to return on days 3, 7, 14 for further rabies vaccines.. Patient will be discharged home with prescriptions for Augmentin and #10 of Norco. Patient is to follow up with this department 3 days for second rabies vaccine or primary care as needed or otherwise directed. Patient is given ED precautions to return to the ED for any worsening or new symptoms.     ____________________________________________  FINAL CLINICAL IMPRESSION(S) / ED DIAGNOSES  Final diagnoses:  Dog bite, initial encounter      NEW MEDICATIONS STARTED DURING THIS VISIT:  ED Discharge Orders        Ordered    amoxicillin-clavulanate (AUGMENTIN) 875-125 MG tablet  2 times daily     05/31/18 2036    HYDROcodone-acetaminophen (NORCO/VICODIN) 5-325 MG tablet  Every 4 hours PRN     05/31/18 2036          This chart was dictated using voice recognition software/Dragon. Despite best efforts to proofread, errors can occur which can change the meaning. Any change was purely unintentional.    Racheal PatchesCuthriell, Ommie Degeorge D, PA-C 05/31/18 2038    Phineas SemenGoodman, Gray[place], MD 05/31/18 [phone]

## 2018-05-31 NOTE — ED Notes (Signed)
Per pt's [employer] supervisor, Boneta LucksJenny [phone], pt does "not need any kind of drug screen or anything like that." Workers comp profile for [employer] states UDS/breathalyzer, etc. only upon request.

## 2018-05-31 NOTE — ED Triage Notes (Signed)
Bit by pit bull R knee while working for [employer]. Police on scene.

## 2018-06-03 ENCOUNTER — Other Ambulatory Visit: Payer: Self-pay

## 2018-06-03 ENCOUNTER — Emergency Department
Admission: EM | Admit: 2018-06-03 | Discharge: 2018-06-03 | Disposition: A | Discharge status: Home / Self Care | Attending: Emergency Medicine | Admitting: Emergency Medicine

## 2018-06-03 ENCOUNTER — Encounter: Payer: Self-pay | Admitting: Emergency Medicine

## 2018-06-03 DIAGNOSIS — Z23 Encounter for immunization: Secondary | ICD-10-CM

## 2018-06-03 MED ORDER — NAPROXEN 500 MG PO TABS
500.0000 mg | ORAL_TABLET | Freq: Once | ORAL | Status: AC
  Administered 2018-06-03: 500 mg via ORAL
  Filled 2018-06-03: qty 1

## 2018-06-03 MED ORDER — OXYCODONE-ACETAMINOPHEN 5-325 MG PO TABS
1.0000 | ORAL_TABLET | Freq: Once | ORAL | Status: AC
  Administered 2018-06-03: 1 via ORAL
  Filled 2018-06-03: qty 1

## 2018-06-03 MED ORDER — RABIES VACCINE, PCEC IM SUSR
1.0000 mL | Freq: Once | INTRAMUSCULAR | Status: AC
  Administered 2018-06-03: 1 mL via INTRAMUSCULAR
  Filled 2018-06-03: qty 1

## 2018-06-03 MED ORDER — NAPROXEN 500 MG PO TABS: 500.0000 mg | ORAL_TABLET | Freq: Two times a day (BID) | ORAL | Status: DC

## 2018-06-03 NOTE — Discharge Instructions (Signed)
Continue previous medication.  Start naproxen as directed.  Follow-up as directed to complete rabies series.

## 2018-06-03 NOTE — ED Triage Notes (Signed)
Pt here for 2nd round rabies vac. S/p dog bite. Also c/o right knee wound throbbing. Pt taking antibiotics as prescribed.

## 2018-06-03 NOTE — ED Notes (Signed)
Right knee wound without s/s of infection. Very painful to touch above site.

## 2018-06-03 NOTE — ED Notes (Signed)
Sterile Band-Aid applied by Lynita LombardBill Smith, RN

## 2018-06-03 NOTE — ED Provider Notes (Signed)
[facility] Emergency Department Provider Note   ____________________________________________   First MD Initiated Contact with Patient 06/03/18 1029     (approximate)  I have reviewed the triage vital signs and the nursing notes.   HISTORY  Chief Complaint Rabies Injection    HPI Ashley Joseph is a 32 y.o. female patient here for second set in her rabies series.  Patient state continue pain at bite site.  Patient described the pain as "throbbing".  Patient continues take antibiotics and pain medication as directed.  Patient rates the pain as a 10/10.  Past Medical History:  Diagnosis Date  . No known health problems     Patient Active Problem List   Diagnosis Date Noted  . Chronic pain of left knee 01/02/2018  . History of smoking 12/10/2017    Past Surgical History:  Procedure Laterality Date  . none      Prior to Admission medications   Medication Sig Start Date End Date Taking? Authorizing Provider  amoxicillin-clavulanate (AUGMENTIN) 875-125 MG tablet Take 1 tablet by mouth 2 (two) times daily. 05/31/18   Cuthriell, Delorise RoyalsJonathan D, PA-C  benzonatate (TESSALON PERLES) 100 MG capsule Take 1 capsule (100 mg total) by mouth 3 (three) times daily as needed for cough. 12/25/17   Renford DillsMiller, Lindsey, NP  chlorpheniramine-HYDROcodone [facility](TUSSIONEX PENNKINETIC ER) 10-8 MG/5ML SUER Take 5 mLs by mouth at bedtime as needed for cough. do not drive or operate machinery while taking as can cause drowsiness. Patient not taking: Reported on 01/02/2018 12/25/17   Renford DillsMiller, Lindsey, NP  fluticasone [facility](FLONASE) 50 MCG/ACT nasal spray Place 1 spray into both nostrils daily. 12/25/17   Renford DillsMiller, Lindsey, NP  HYDROcodone-acetaminophen (NORCO/VICODIN) 5-325 MG tablet Take 1 tablet by mouth every 4 (four) hours as needed for moderate pain. 05/31/18   Cuthriell, Delorise RoyalsJonathan D, PA-C  naproxen (NAPROSYN) 500 MG tablet Take 1 tablet (500 mg total) by mouth 2 (two) times daily with a meal. 06/03/18    Joni ReiningSmith, Neziah Braley K, PA-C    Allergies Patient has no known allergies.  Family History  Problem Relation Age of Onset  . Stroke Mother   . Hypertension Mother   . Congestive Heart Failure Mother   . Heart failure Mother   . Hypertension Father   . ALS Father   . Diabetes Maternal Aunt   . Congestive Heart Failure Maternal Grandmother   . Stroke Maternal Grandmother   . Healthy Sister   . Other Brother        surgical removal of tumor affecting optic nerve  . Healthy Sister   . Healthy Brother     Social History Social History   Tobacco Use  . Smoking status: Former Smoker    Years: 6.00    Types: Cigars    Last attempt to quit: 02/2017    Years since quitting: 1.2  . Smokeless tobacco: Never Used  . Tobacco comment: Black mild 5-6 per day  Substance Use Topics  . Alcohol use: Yes    Comment: socially  . Drug use: No    Review of Systems Constitutional: No fever/chills Eyes: No visual changes. ENT: No sore throat. Cardiovascular: Denies chest pain. Respiratory: Denies shortness of breath. Gastrointestinal: No abdominal pain.  No nausea, no vomiting.  No diarrhea.  No constipation. Genitourinary: Negative for dysuria. Musculoskeletal: Negative for back pain. Skin: Negative for rash.  Dog bite right leg Neurological: Negative for headaches, focal weakness or numbness.   ____________________________________________   PHYSICAL EXAM:  VITAL SIGNS:  ED Triage Vitals  Enc Vitals Group     BP      Pulse      Resp      Temp      Temp src      SpO2      Weight      Height      Head Circumference      Peak Flow      Pain Score      Pain Loc      Pain Edu?      Excl. in GC?    Constitutional: Alert and oriented. Well appearing and in no acute distress. Cardiovascular: Normal rate, regular rhythm. Grossly normal heart sounds.  Good peripheral circulation. Respiratory: Normal respiratory effort.  No retractions. Lungs CTAB. Musculoskeletal: No lower  extremity tenderness nor edema.  No joint effusions. Neurologic:  Normal speech and language. No gross focal neurologic deficits are appreciated. No gait instability. Skin:  Skin is warm, dry and intact. No rash noted.  Healing puncture wounds secondary to dog bite right medial thigh. Psychiatric: Mood and affect are normal. Speech and behavior are normal.  ____________________________________________   LABS (all labs ordered are listed, but only abnormal results are displayed)  Labs Reviewed - No data to display ____________________________________________  EKG   ____________________________________________  RADIOLOGY  ED MD interpretation:    Official radiology report(s): No results found.  ____________________________________________   PROCEDURES  Procedure(s) performed: None  Procedures  Critical Care performed: No  ____________________________________________   INITIAL IMPRESSION / ASSESSMENT AND PLAN / ED COURSE  As part of my medical decision making, I reviewed the following data within the electronic MEDICAL RECORD NUMBER    Patient presents today for first second series of rabies shots.  Patient continues to complain of pain secondary to dog bite.  Wound shows no sign of secondary infection.  Patient given discharge care instruction advised to continue taking previous medications and follow-up to complete series.      ____________________________________________   FINAL CLINICAL IMPRESSION(S) / ED DIAGNOSES  Final diagnoses:  Rabies, need for prophylactic vaccination against     ED Discharge Orders        Ordered    naproxen (NAPROSYN) 500 MG tablet  2 times daily with meals     06/03/18 1057       Note:  This document was prepared using Dragon voice recognition software and may include unintentional dictation errors.    Joni Reining, PA-C 06/03/18 1103    Jene Every, MD 06/03/18 [phone]

## 2018-06-07 ENCOUNTER — Other Ambulatory Visit: Payer: Self-pay

## 2018-06-07 ENCOUNTER — Emergency Department
Admission: EM | Admit: 2018-06-07 | Discharge: 2018-06-07 | Disposition: A | Discharge status: Home / Self Care | Attending: Emergency Medicine | Admitting: Emergency Medicine

## 2018-06-07 DIAGNOSIS — Z87891 Personal history of nicotine dependence: Secondary | ICD-10-CM | POA: Insufficient documentation

## 2018-06-07 DIAGNOSIS — Z79899 Other long term (current) drug therapy: Secondary | ICD-10-CM | POA: Diagnosis not present

## 2018-06-07 DIAGNOSIS — Z23 Encounter for immunization: Secondary | ICD-10-CM | POA: Diagnosis present

## 2018-06-07 MED ORDER — RABIES VACCINE, PCEC IM SUSR
1.0000 mL | Freq: Once | INTRAMUSCULAR | Status: AC
  Administered 2018-06-07: 1 mL via INTRAMUSCULAR
  Filled 2018-06-07: qty 1

## 2018-06-07 NOTE — Discharge Instructions (Signed)
Discontinue taking your antibiotic.  Continue to keep area clean and dry.  Clean area daily with mild soap and water.  Return for remaining rabies injections.

## 2018-06-07 NOTE — ED Triage Notes (Signed)
Pt here for second round of rabies shots. Alert, oriented. No distress noted.

## 2018-06-07 NOTE — ED Provider Notes (Signed)
[facility] Emergency Department Provider Note  ____________________________________________   First MD Initiated Contact with Patient 06/07/18 0801     (approximate)  I have reviewed the triage vital signs and the nursing notes.   HISTORY  Chief Complaint Rabies Injection   HPI Ashley Joseph is a 32 y.o. female is here for third rabies injection.  Patient has not had any difficulties leg is healing.  Patient states that since starting on the antibiotic she has developed some loose stools that are frequent.  Most likely this is because of the Augmentin.  She denies any fever or chills.  There is been no discharge from her animal bite.  Past Medical History:  Diagnosis Date  . No known health problems     Patient Active Problem List   Diagnosis Date Noted  . Chronic pain of left knee 01/02/2018  . History of smoking 12/10/2017    Past Surgical History:  Procedure Laterality Date  . none      Prior to Admission medications   Medication Sig Start Date End Date Taking? Authorizing Provider  fluticasone (FLONASE) 50 MCG/ACT nasal spray Place 1 spray into both nostrils daily. 12/25/17   Renford Dills, NP  HYDROcodone-acetaminophen (NORCO/VICODIN) 5-325 MG tablet Take 1 tablet by mouth every 4 (four) hours as needed for moderate pain. 05/31/18   Cuthriell, Delorise Royals, PA-C  naproxen (NAPROSYN) 500 MG tablet Take 1 tablet (500 mg total) by mouth 2 (two) times daily with a meal. 06/03/18   Joni Reining, PA-C    Allergies Patient has no known allergies.  Family History  Problem Relation Age of Onset  . Stroke Mother   . Hypertension Mother   . Congestive Heart Failure Mother   . Heart failure Mother   . Hypertension Father   . ALS Father   . Diabetes Maternal Aunt   . Congestive Heart Failure Maternal Grandmother   . Stroke Maternal Grandmother   . Healthy Sister   . Other Brother        surgical removal of tumor affecting optic nerve  .  Healthy Sister   . Healthy Brother     Social History Social History   Tobacco Use  . Smoking status: Former Smoker    Years: 6.00    Types: Cigars    Last attempt to quit: 02/2017    Years since quitting: 1.2  . Smokeless tobacco: Never Used  . Tobacco comment: Black mild 5-6 per day  Substance Use Topics  . Alcohol use: Yes    Comment: socially  . Drug use: No    Review of Systems Constitutional: No fever/chills Cardiovascular: Denies chest pain. Respiratory: Denies shortness of breath. Gastrointestinal: No abdominal pain.  No nausea, no vomiting.  Positive diarrhea.   Skin: Positive for dog bite. Neurological: Negative for headaches, focal weakness or numbness. ___________________________________________   PHYSICAL EXAM:  VITAL SIGNS: ED Triage Vitals  Enc Vitals Group     BP 06/07/18 0757 100/60     Pulse Rate 06/07/18 0757 77     Resp 06/07/18 0757 16     Temp 06/07/18 0757 98.8 F (37.1 C)     Temp Source 06/07/18 0757 Oral     SpO2 06/07/18 0757 100 %     Weight 06/07/18 0756 135 lb (61.2 kg)     Height 06/07/18 0756 5\' 5"  (1.651 m)     Head Circumference --      Peak Flow --  Pain Score 06/07/18 0756 0     Pain Loc --      Pain Edu? --      Excl. in GC? --    Constitutional: Alert and oriented. Well appearing and in no acute distress. Eyes: Conjunctivae are normal.  Head: Atraumatic. Neck: No stridor.   Cardiovascular: Normal rate, regular rhythm. Grossly normal heart sounds.  Good peripheral circulation. Respiratory: Normal respiratory effort.  No retractions. Lungs CTAB. Musculoskeletal: Minimal tenderness is noted to the lower extremity and dog bite appears to be healing without any signs of infection. Neurologic:  Normal speech and language. No gross focal neurologic deficits are appreciated.  Skin:  Skin is warm, dry.  Psychiatric: Mood and affect are normal. Speech and behavior are normal.  ____________________________________________    LABS (all labs ordered are listed, but only abnormal results are displayed)  Labs Reviewed - No data to display  PROCEDURES  Procedure(s) performed: None  Procedures  Critical Care performed: No  ____________________________________________   INITIAL IMPRESSION / ASSESSMENT AND PLAN / ED COURSE  As part of my medical decision making, I reviewed the following data within the electronic MEDICAL RECORD NUMBER Notes from prior ED visits and [place] Controlled Substance Database  Patient was given third rabies injection.  She will discontinue taking the Augmentin as she has developed diarrhea from this medication.  She will continue to clean the wound daily with mild soap and water. ____________________________________________   FINAL CLINICAL IMPRESSION(S) / ED DIAGNOSES  Final diagnoses:  Encounter for repeat administration of rabies vaccination     ED Discharge Orders    None       Note:  This document was prepared using Dragon voice recognition software and may include unintentional dictation errors.    Tommi RumpsSummers, Haden Cavenaugh L, PA-C 06/07/18 1547    Minna AntisPaduchowski, Kevin, MD 06/08/18 1246

## 2018-06-11 ENCOUNTER — Encounter: Payer: Self-pay | Admitting: Nurse Practitioner

## 2018-06-14 ENCOUNTER — Other Ambulatory Visit: Payer: Self-pay

## 2018-06-14 ENCOUNTER — Encounter: Payer: Self-pay | Admitting: Emergency Medicine

## 2018-06-14 ENCOUNTER — Emergency Department
Admission: EM | Admit: 2018-06-14 | Discharge: 2018-06-14 | Disposition: A | Discharge status: Home / Self Care | Attending: Emergency Medicine | Admitting: Emergency Medicine

## 2018-06-14 DIAGNOSIS — Z2914 Encounter for prophylactic rabies immune globin: Secondary | ICD-10-CM | POA: Insufficient documentation

## 2018-06-14 DIAGNOSIS — Z87891 Personal history of nicotine dependence: Secondary | ICD-10-CM | POA: Diagnosis not present

## 2018-06-14 DIAGNOSIS — Z79899 Other long term (current) drug therapy: Secondary | ICD-10-CM | POA: Diagnosis not present

## 2018-06-14 DIAGNOSIS — Z23 Encounter for immunization: Secondary | ICD-10-CM | POA: Insufficient documentation

## 2018-06-14 DIAGNOSIS — T148XXA Other injury of unspecified body region, initial encounter: Secondary | ICD-10-CM

## 2018-06-14 MED ORDER — RABIES VACCINE, PCEC IM SUSR
1.0000 mL | Freq: Once | INTRAMUSCULAR | Status: AC
  Administered 2018-06-14: 1 mL via INTRAMUSCULAR
  Filled 2018-06-14: qty 1

## 2018-06-14 NOTE — Discharge Instructions (Addendum)
Please return to the emergency department for any fever, worsening pain around wound, knee pain, drainage from wound.

## 2018-06-14 NOTE — ED Triage Notes (Signed)
Here for last of rabies series.

## 2018-06-14 NOTE — ED Provider Notes (Signed)
[facility] Emergency Department Provider Note  ____________________________________________  Time seen: Approximately 7:54 AM  I have reviewed the triage vital signs and the nursing notes.   HISTORY  Chief Complaint Rabies Injection    HPI Ashley Joseph is a 32 y.o. female that presents to the emergency department for final rabies vaccination.  She has had a small amount of clear fluid drain from wound.  No surrounding erythema.  It is still mildly tender to touch.  No fevers.  Past Medical History:  Diagnosis Date  . No known health problems     Patient Active Problem List   Diagnosis Date Noted  . Chronic pain of left knee 01/02/2018  . History of smoking 12/10/2017    Past Surgical History:  Procedure Laterality Date  . none      Prior to Admission medications   Medication Sig Start Date End Date Taking? Authorizing Provider  fluticasone (FLONASE) 50 MCG/ACT nasal spray Place 1 spray into both nostrils daily. 12/25/17   Renford DillsMiller, Lindsey, NP  HYDROcodone-acetaminophen (NORCO/VICODIN) 5-325 MG tablet Take 1 tablet by mouth every 4 (four) hours as needed for moderate pain. 05/31/18   Cuthriell, Delorise RoyalsJonathan D, PA-C  naproxen (NAPROSYN) 500 MG tablet Take 1 tablet (500 mg total) by mouth 2 (two) times daily with a meal. 06/03/18   Joni ReiningSmith, Ronald K, PA-C    Allergies Patient has no known allergies.  Family History  Problem Relation Age of Onset  . Stroke Mother   . Hypertension Mother   . Congestive Heart Failure Mother   . Heart failure Mother   . Hypertension Father   . ALS Father   . Diabetes Maternal Aunt   . Congestive Heart Failure Maternal Grandmother   . Stroke Maternal Grandmother   . Healthy Sister   . Other Brother        surgical removal of tumor affecting optic nerve  . Healthy Sister   . Healthy Brother     Social History Social History   Tobacco Use  . Smoking status: Former Smoker    Years: 6.00    Types: Cigars    Last  attempt to quit: 02/2017    Years since quitting: 1.3  . Smokeless tobacco: Never Used  . Tobacco comment: Black mild 5-6 per day  Substance Use Topics  . Alcohol use: Yes    Comment: socially  . Drug use: No     Review of Systems  Constitutional: No fever/chills Gastrointestinal: No nausea, no vomiting.  Musculoskeletal: Negative for musculoskeletal pain. Skin: Negative for rash, ecchymosis. Positive for wound.    ____________________________________________   PHYSICAL EXAM:  VITAL SIGNS: ED Triage Vitals  Enc Vitals Group     BP 06/14/18 0749 99/70     Pulse Rate 06/14/18 0749 87     Resp 06/14/18 0749 20     Temp 06/14/18 0749 98.3 F (36.8 C)     Temp Source 06/14/18 0749 Oral     SpO2 06/14/18 0749 100 %     Weight 06/14/18 0750 135 lb (61.2 kg)     Height 06/14/18 0750 5\' 5"  (1.651 m)     Head Circumference --      Peak Flow --      Pain Score 06/14/18 0750 0     Pain Loc --      Pain Edu? --      Excl. in GC? --      Constitutional: Alert and oriented. Well appearing and in  no acute distress. Eyes: Conjunctivae are normal. PERRL. EOMI. Head: Atraumatic. ENT:      Ears:      Nose: No congestion/rhinnorhea.      Mouth/Throat: Mucous membranes are moist.  Neck: No stridor. Cardiovascular: Normal rate, regular rhythm.  Good peripheral circulation. Respiratory: Normal respiratory effort without tachypnea or retractions. Lungs CTAB. Good air entry to the bases with no decreased or absent breath sounds. Musculoskeletal: Full range of motion to all extremities. No gross deformities appreciated. Full ROM of knee without pain.  Neurologic:  Normal speech and language. No gross focal neurologic deficits are appreciated.  Skin:  Skin is warm, dry and intact. Healing puncture wounds to right medial thigh. No drainage. No surrounding erythema.  Psychiatric: Mood and affect are normal. Speech and behavior are normal. Patient exhibits appropriate insight and  judgement.   ____________________________________________   LABS (all labs ordered are listed, but only abnormal results are displayed)  Labs Reviewed - No data to display ____________________________________________  EKG   ____________________________________________  RADIOLOGY   No results found.  ____________________________________________    PROCEDURES  Procedure(s) performed:    Procedures    Medications  rabies vaccine (RABAVERT) injection 1 mL (1 mL Intramuscular Given 06/14/18 0811)     ____________________________________________   INITIAL IMPRESSION / ASSESSMENT AND PLAN / ED COURSE  Pertinent labs & imaging results that were available during my care of the patient were reviewed by me and considered in my medical decision making (see chart for details).  Review of the [place] CSRS was performed in accordance of the [facility] prior to dispensing any controlled drugs.   The presents emergency department for final rabies vaccination.  Vital signs and exam are reassuring.  Final rabies vaccination was given.  Patient is to follow up with PCP as directed. Patient is given ED precautions to return to the ED for any worsening or new symptoms.     ____________________________________________  FINAL CLINICAL IMPRESSION(S) / ED DIAGNOSES  Final diagnoses:  Animal bite  Need for rabies vaccination      NEW MEDICATIONS STARTED DURING THIS VISIT:  ED Discharge Orders    None          This chart was dictated using voice recognition software/Dragon. Despite best efforts to proofread, errors can occur which can change the meaning. Any change was purely unintentional.    Enid Derry, PA-C 06/14/18 1126    Schaevitz, Myra Rude, MD 06/14/18 [phone]

## 2018-06-17 ENCOUNTER — Other Ambulatory Visit: Payer: Self-pay

## 2018-06-17 ENCOUNTER — Encounter: Payer: Self-pay | Admitting: Nurse Practitioner

## 2018-06-17 ENCOUNTER — Ambulatory Visit (INDEPENDENT_AMBULATORY_CARE_PROVIDER_SITE_OTHER): Admitting: Nurse Practitioner

## 2018-06-17 VITALS — BP 114/63 | HR 84 | Temp 98.4°F | Ht 65.0 in | Wt 124.0 lb

## 2018-06-17 DIAGNOSIS — S81052D Open bite, left knee, subsequent encounter: Secondary | ICD-10-CM

## 2018-06-17 DIAGNOSIS — W540XXD Bitten by dog, subsequent encounter: Secondary | ICD-10-CM

## 2018-06-17 NOTE — Progress Notes (Signed)
   Subjective:    Patient ID: Ashley HeinzJasmine Joseph, female    DOB: 07/13/1986, 32 y.o.   MRN: [id number]  Ashley HeinzJasmine Joseph is a 32 y.o. female presenting on 06/17/2018 for Animal Bite (pt was bite on the right knee by a dog x 17 days )   HPI Dog Bite Patient presents today for follow-up of dog bite to her right lower leg at medial knee.  Injury occurred on 05/31/2018.  She originally had care including rabies treatment for prophylaxis through the emergency department. She presents today with concerns for persistent swelling and pain at site of wound.  Social History   Tobacco Use  . Smoking status: Former Smoker    Years: 6.00    Types: Cigars    Last attempt to quit: 02/2017    Years since quitting: 1.3  . Smokeless tobacco: Never Used  . Tobacco comment: Black mild 5-6 per day  Substance Use Topics  . Alcohol use: Yes    Comment: socially  . Drug use: No    Review of Systems Per HPI unless specifically indicated above     Objective:    BP 114/63 (BP Location: Left Arm, Patient Position: Sitting, Cuff Size: Small)   Pulse 84   Temp 98.4 F (36.9 C) (Oral)   Ht 5\' 5"  (1.651 m)   Wt 124 lb (56.2 kg)   BMI 20.63 kg/m   Wt Readings from Last 3 Encounters:  06/17/18 124 lb (56.2 kg)  06/14/18 135 lb (61.2 kg)  06/07/18 135 lb (61.2 kg)    Physical Exam  Constitutional: She is oriented to person, place, and time. She appears well-developed and well-nourished. No distress.  HENT:  Head: Normocephalic and atraumatic.  Cardiovascular: Normal rate, regular rhythm, S1 normal, S2 normal, normal heart sounds and intact distal pulses.  Pulmonary/Chest: Effort normal and breath sounds normal. No respiratory distress.  Neurological: She is alert and oriented to person, place, and time.  Skin: Skin is warm and dry.     Psychiatric: She has a normal mood and affect. Her behavior is normal.  Vitals reviewed.    Results for orders placed or performed in visit on 01/06/18  POCT Occult  Blood Stool  Result Value Ref Range   Fecal Occult Blood, POC Negative Negative   Card #1 Date 01/03/18    Card #2 Fecal Occult Blod, POC Negative    Card #2 Date 01/05/2018    Card #3 Fecal Occult Blood, POC Negative    Card #3 Date 01/06/2018       Assessment & Plan:   Problem List Items Addressed This Visit    None    Visit Diagnoses    Dog bite of knee, left, subsequent encounter    -  Primary    Stable.  Instructed patient to continue wound care without change.  Reviewed signs and symptoms of wound complications and provided patient education.  Follow-up prn 7-14 days if no improvement.   Follow up plan: Return 7-14 days if symptoms worsen or fail to improve.  Wilhelmina McardleLauren Tyheem Boughner, DNP, AGPCNP-BC Adult Gerontology Primary Care Nurse Practitioner [facility] 06/17/2018, 10:26 AM

## 2018-06-17 NOTE — Patient Instructions (Addendum)
Ashley HeinzJasmine Gorelick,   Thank you for coming in to clinic today.  1. Your body is continuing to heal well.  Watch for infection.  Call workman's comp provider.    Please schedule a follow-up appointment with Wilhelmina McardleLauren Zeferino Mounts, AGNP. Return 7-14 days if symptoms worsen or fail to improve.  If you have any other questions or concerns, please feel free to call the clinic or send a message through MyChart. You may also schedule an earlier appointment if necessary.  You will receive a survey after today's visit either digitally by e-mail or paper by [employer] mail. Your experiences and feedback matter to us.  Please respond so we know how we are doing as we provide care for you.   Wilhelmina McardleLauren Yannet Rincon, DNP, AGNP-BC Adult Gerontology Nurse Practitioner [facility], [facility]   We are trying to prevent a skin abscess.  These are things to look for and to seek care if they happen.  Skin Abscess A skin abscess is an infected area on or under your skin that contains a collection of pus and other material. An abscess may also be called a furuncle, carbuncle, or boil. An abscess can occur in or on almost any part of your body. Some abscesses break open (rupture) on their own. Most continue to get worse unless they are treated. The infection can spread deeper into the body and eventually into your blood, which can make you feel ill. Treatment usually involves draining the abscess. What are the causes? An abscess occurs when germs, often bacteria, pass through your skin and cause an infection. This may be caused by:  A scrape or cut on your skin.  A puncture wound through your skin, including a needle injection.  Blocked oil or sweat glands.  Blocked and infected hair follicles.  A cyst that forms beneath your skin (sebaceous cyst) and becomes infected.  What increases the risk? This condition is more likely to develop in people who:  Have a weak body defense system (immune system).  Have  diabetes.  Have dry and irritated skin.  Get frequent injections or use illegal IV drugs.  Have a foreign body in a wound, such as a splinter.  Have problems with their lymph system or veins.  What are the signs or symptoms? An abscess may start as a painful, firm bump under the skin. Over time, the abscess may get larger or become softer. Pus may appear at the top of the abscess, causing pressure and pain. It may eventually break through the skin and drain. Other symptoms include:  Redness.  Warmth.  Swelling.  Tenderness.  A sore on the skin.  How is this diagnosed? This condition is diagnosed based on your medical history and a physical exam. A sample of pus may be taken from the abscess to find out what is causing the infection and what antibiotics can be used to treat it. You also may have:  Blood tests to look for signs of infection or spread of an infection to your blood.  Imaging studies such as ultrasound, CT scan, or MRI if the abscess is deep.  How is this treated? Small abscesses that drain on their own may not need treatment. Treatment for an abscess that does not rupture on its own may include:  Warm compresses applied to the area several times per day.  Incision and drainage. Your health care provider will make an incision to open the abscess and will remove pus and any foreign body or dead  tissue. The incision area may be packed with gauze to keep it open for a few days while it heals.  Antibiotic medicines to treat infection. For a severe abscess, you may first get antibiotics through an IV and then change to oral antibiotics.  Follow these instructions at home: Abscess Care  If you have an abscess that has not drained, place a warm, clean, wet washcloth over the abscess several times a day. Do this as told by your health care provider.  Follow instructions from your health care provider about how to take care of your abscess. Make sure you: ? Cover the  abscess with a bandage (dressing). ? Change your dressing or gauze as told by your health care provider. ? Wash your hands with soap and water before you change the dressing or gauze. If soap and water are not available, use hand sanitizer.  Check your abscess every day for signs of a worsening infection. Check for: ? More redness, swelling, or pain. ? More fluid or blood. ? Warmth. ? More pus or a bad smell. Medicines  Take over-the-counter and prescription medicines only as told by your health care provider.  If you were prescribed an antibiotic medicine, take it as told by your health care provider. Do not stop taking the antibiotic even if you start to feel better. General instructions  To avoid spreading the infection: ? Do not share personal care items, towels, or hot tubs with others. ? Avoid making skin contact with other people.  Keep all follow-up visits as told by your health care provider. This is important. Contact a health care provider if:  You have more redness, swelling, or pain around your abscess.  You have more fluid or blood coming from your abscess.  Your abscess feels warm to the touch.  You have more pus or a bad smell coming from your abscess.  You have a fever.  You have muscle aches.  You have chills or a general ill feeling. Get help right away if:  You have severe pain.  You see red streaks on your skin spreading away from the abscess. This information is not intended to replace advice given to you by your health care provider. Make sure you discuss any questions you have with your health care provider. Document Released: 08/22/2005 Document Revised: 07/08/2016 Document Reviewed: 09/21/2015 Elsevier Interactive Patient Education  [employer]2018 Elsevier Inc[employer]

## 2018-06-30 ENCOUNTER — Encounter: Payer: Self-pay | Admitting: Family Medicine

## 2018-06-30 ENCOUNTER — Other Ambulatory Visit: Payer: Self-pay

## 2018-06-30 ENCOUNTER — Encounter: Payer: Self-pay | Admitting: Nurse Practitioner

## 2018-06-30 ENCOUNTER — Ambulatory Visit (INDEPENDENT_AMBULATORY_CARE_PROVIDER_SITE_OTHER): Admitting: Nurse Practitioner

## 2018-06-30 VITALS — BP 95/54 | HR 79 | Temp 98.2°F | Ht 65.0 in | Wt 117.4 lb

## 2018-06-30 DIAGNOSIS — W540XXD Bitten by dog, subsequent encounter: Secondary | ICD-10-CM

## 2018-06-30 DIAGNOSIS — S81852D Open bite, left lower leg, subsequent encounter: Secondary | ICD-10-CM

## 2018-06-30 NOTE — Progress Notes (Signed)
Subjective:    Patient ID: Ashley Joseph, female    DOB: June 09, 1986, 32 y.o.   MRN: [id number]  Ashley Joseph is a 32 y.o. female presenting on 06/30/2018 for Animal Bite (x 3.5 weeks )   HPI Animal Bite Patient presents today for follow-up of dog bite to her right lower leg at medial knee.  Injury occurred on 05/31/2018.  She originally had care including rabies treatment for prophylaxis through the emergency department.  She presented to my clinic 06/17/2018 for evaluation with persistent subcutaneous cyst with minimal clear drainage from her wounds.  Today she states she is ready to return to light duty.  Work is requiring letter from her physician or medical provider.  Patient reports soreness only when standing for long period of time greater than 2 to 3 hours.  Mild soreness when she is walking and lifting persists.  Aleve 440 mg twice daily has been completed for the last 2 weeks and has helped reduce swelling as well as pain.  Patient continues to apply ice for reduction of swelling and pain.  Social History   Tobacco Use  . Smoking status: Former Smoker    Years: 6.00    Types: Cigars    Last attempt to quit: 02/2017    Years since quitting: 1.3  . Smokeless tobacco: Never Used  . Tobacco comment: Black mild 5-6 per day  Substance Use Topics  . Alcohol use: Yes    Comment: socially  . Drug use: No    Review of Systems Per HPI unless specifically indicated above     Objective:    BP (!) 95/54 (BP Location: Right Arm, Patient Position: Sitting, Cuff Size: Small)   Pulse 79   Temp 98.2 F (36.8 C) (Oral)   Ht 5\' 5"  (1.651 m)   Wt 117 lb 6.4 oz (53.3 kg)   BMI 19.54 kg/m   Wt Readings from Last 3 Encounters:  06/30/18 117 lb 6.4 oz (53.3 kg)  06/17/18 124 lb (56.2 kg)  06/14/18 135 lb (61.2 kg)    Physical Exam  Constitutional: She is oriented to person, place, and time. She appears well-developed and well-nourished. No distress.  HENT:  Head: Normocephalic and  atraumatic.  Neurological: She is alert and oriented to person, place, and time.  Skin: Skin is warm and dry. Capillary refill takes less than 2 seconds.     Psychiatric: She has a normal mood and affect. Her behavior is normal. Judgment and thought content normal.  Vitals reviewed.   Results for orders placed or performed in visit on 01/06/18  POCT Occult Blood Stool  Result Value Ref Range   Fecal Occult Blood, POC Negative Negative   Card #1 Date 01/03/18    Card #2 Fecal Occult Blod, POC Negative    Card #2 Date 01/05/2018    Card #3 Fecal Occult Blood, POC Negative    Card #3 Date 01/06/2018       Assessment & Plan:   Problem List Items Addressed This Visit    None    Visit Diagnoses    Animal bite of left lower leg, subsequent encounter    -  Primary    Continued improvement.  Wound well healing on surface of skin with reduction of subcutaneous swelling/cyst.  Patient ready to return to work for light duty. -Letter provided with consideration of return to full duty at 2 weeks on July 14, 2018 -Continue Aleve twice daily x2 additional weeks. -Apply heat and ice for  management of swelling and continued wound healing.  Use ice during the day at work, heat at end of day. -Follow-up 2 weeks as needed.   Follow up plan: Return in 2 weeks if symptoms worsen or fail to improve.  Wilhelmina McardleLauren Hollyn Stucky, DNP, AGPCNP-BC Adult Gerontology Primary Care Nurse Practitioner [facility] 06/30/2018, 8:35 AM

## 2018-06-30 NOTE — Patient Instructions (Addendum)
Ashley HeinzJasmine Joseph,   Thank you for coming in to clinic today.  1. Continue Aleeve as needed, even twice daily for the next 2 weeks.  2. Apply heat or ice for 15 minutes up to 4 times daily.  (Ice while at work, Heat at end of the day).  Your letter states: It is my medical opinion that Ashley HeinzJasmine Hamme may return to light duty immediately with the following restrictions: Standing 2 hours, walking 30 minutes, lifting 30 lbs or less with break for sitting 5-15 mintues every 2 hours. This restriction applies for 2 weeks.  Then, [name] may be re-evaluated or may return to full duty if pain free on Monday, August 19th, 2019.  Please schedule a follow-up appointment with Wilhelmina McardleLauren Koa Zoeller, AGNP. Return in 2 weeks as needed if symptoms worsen or fail to improve.  If you have any other questions or concerns, please feel free to call the clinic or send a message through MyChart. You may also schedule an earlier appointment if necessary.  You will receive a survey after today's visit either digitally by e-mail or paper by [employer] mail. Your experiences and feedback matter to us.  Please respond so we know how we are doing as we provide care for you.   Wilhelmina McardleLauren Ande Therrell, DNP, AGNP-BC Adult Gerontology Nurse Practitioner [facility], [facility]

## 2018-10-02 ENCOUNTER — Emergency Department
Admission: EM | Admit: 2018-10-02 | Discharge: 2018-10-02 | Disposition: A | Discharge status: Home / Self Care | Payer: No Typology Code available for payment source | Attending: Emergency Medicine | Admitting: Emergency Medicine

## 2018-10-02 ENCOUNTER — Emergency Department: Payer: No Typology Code available for payment source

## 2018-10-02 ENCOUNTER — Encounter: Payer: Self-pay | Admitting: Medical Oncology

## 2018-10-02 DIAGNOSIS — Y990 Civilian activity done for income or pay: Secondary | ICD-10-CM | POA: Insufficient documentation

## 2018-10-02 DIAGNOSIS — Y929 Unspecified place or not applicable: Secondary | ICD-10-CM | POA: Diagnosis not present

## 2018-10-02 DIAGNOSIS — X500XXA Overexertion from strenuous movement or load, initial encounter: Secondary | ICD-10-CM | POA: Insufficient documentation

## 2018-10-02 DIAGNOSIS — S83281A Other tear of lateral meniscus, current injury, right knee, initial encounter: Secondary | ICD-10-CM | POA: Diagnosis not present

## 2018-10-02 DIAGNOSIS — Z87891 Personal history of nicotine dependence: Secondary | ICD-10-CM | POA: Insufficient documentation

## 2018-10-02 DIAGNOSIS — M25561 Pain in right knee: Secondary | ICD-10-CM | POA: Diagnosis present

## 2018-10-02 DIAGNOSIS — Y939 Activity, unspecified: Secondary | ICD-10-CM | POA: Diagnosis not present

## 2018-10-02 DIAGNOSIS — Z79899 Other long term (current) drug therapy: Secondary | ICD-10-CM | POA: Insufficient documentation

## 2018-10-02 MED ORDER — ONDANSETRON HCL 4 MG/2ML IJ SOLN: 4.0000 mg | Freq: Once | INTRAMUSCULAR | Status: DC

## 2018-10-02 MED ORDER — KETOROLAC TROMETHAMINE 30 MG/ML IJ SOLN
INTRAMUSCULAR | Status: AC
  Filled 2018-10-02: qty 1

## 2018-10-02 MED ORDER — CYCLOBENZAPRINE HCL 10 MG PO TABS: 10.0000 mg | ORAL_TABLET | Freq: Once | ORAL | Status: DC

## 2018-10-02 MED ORDER — MELOXICAM 15 MG PO TABS: 15.0000 mg | ORAL_TABLET | Freq: Every day | ORAL | 2 refills | Status: AC

## 2018-10-02 MED ORDER — KETOROLAC TROMETHAMINE 30 MG/ML IJ SOLN
30.0000 mg | Freq: Once | INTRAMUSCULAR | Status: AC
  Administered 2018-10-02: 30 mg via INTRAVENOUS

## 2018-10-02 MED ORDER — KETOROLAC TROMETHAMINE 30 MG/ML IJ SOLN
30.0000 mg | Freq: Once | INTRAMUSCULAR | Status: AC
  Administered 2018-10-02: 30 mg via INTRAVENOUS
  Filled 2018-10-02: qty 1

## 2018-10-02 MED ORDER — MORPHINE SULFATE (PF) 4 MG/ML IV SOLN: 4.0000 mg | Freq: Once | INTRAVENOUS | Status: DC

## 2018-10-02 NOTE — Discharge Instructions (Addendum)
Follow-up with Dr. Rosita Kea.  Please call him for an appointment.  Apply ice to the knee for 15 minutes at least 3 times daily.  Apply the ice to the knee especially when you get home from work.  Take the meloxicam daily.

## 2018-10-02 NOTE — ED Notes (Signed)
Pt back from MRI 

## 2018-10-02 NOTE — ED Provider Notes (Signed)
[facility] Emergency Department Provider Note  ____________________________________________   First MD Initiated Contact with Patient 10/02/18 1824     (approximate)  I have reviewed the triage vital signs and the nursing notes.   HISTORY  Chief Complaint Leg Pain    HPI Ashley Joseph is a 32 y.o. female complains of right knee pain.  She states she was at work and had severe pain to the right knee.  She was twisting when it happened.  The patient states the pain shot down her leg.  She has had difficulty bearing weight since.  She is also concerned she had a dog bite to the same knee in July.  She states she did take the medication for 1 week and then stopped due to the stomach upset.    Past Medical History:  Diagnosis Date  . No known health problems     Patient Active Problem List   Diagnosis Date Noted  . Chronic pain of left knee 01/02/2018  . History of smoking 12/10/2017    Past Surgical History:  Procedure Laterality Date  . none      Prior to Admission medications   Medication Sig Start Date End Date Taking? Authorizing Provider  meloxicam (MOBIC) 15 MG tablet Take 1 tablet (15 mg total) by mouth daily. 10/02/18 10/02/19  Faythe Ghee, PA-C    Allergies Patient has no known allergies.  Family History  Problem Relation Age of Onset  . Stroke Mother   . Hypertension Mother   . Congestive Heart Failure Mother   . Heart failure Mother   . Hypertension Father   . ALS Father   . Diabetes Maternal Aunt   . Congestive Heart Failure Maternal Grandmother   . Stroke Maternal Grandmother   . Healthy Sister   . Other Brother        surgical removal of tumor affecting optic nerve  . Healthy Sister   . Healthy Brother     Social History Social History   Tobacco Use  . Smoking status: Former Smoker    Years: 6.00    Types: Cigars    Last attempt to quit: 02/2017    Years since quitting: 1.6  . Smokeless tobacco: Never Used   . Tobacco comment: Black mild 5-6 per day  Substance Use Topics  . Alcohol use: Yes    Comment: socially  . Drug use: No    Review of Systems  Constitutional: No fever/chills Eyes: No visual changes. ENT: No sore throat. Respiratory: Denies cough Genitourinary: Negative for dysuria. Musculoskeletal: Negative for back pain.  Positive for right knee pain Skin: Negative for rash.    ____________________________________________   PHYSICAL EXAM:  VITAL SIGNS: ED Triage Vitals  Enc Vitals Group     BP 10/02/18 1801 102/60     Pulse Rate 10/02/18 1801 93     Resp 10/02/18 1801 16     Temp 10/02/18 1801 98.5 F (36.9 C)     Temp Source 10/02/18 1801 Oral     SpO2 10/02/18 1801 99 %     Weight 10/02/18 1758 125 lb (56.7 kg)     Height 10/02/18 1758 5\' 5"  (1.651 m)     Head Circumference --      Peak Flow --      Pain Score 10/02/18 1757 8     Pain Loc --      Pain Edu? --      Excl. in GC? --  Constitutional: Alert and oriented. Well appearing and in no acute distress. Eyes: Conjunctivae are normal.  Head: Atraumatic. Nose: No congestion/rhinnorhea. Mouth/Throat: Mucous membranes are moist.   Neck:  supple no lymphadenopathy noted Cardiovascular: Normal rate, regular rhythm. Heart sounds are normal Respiratory: Normal respiratory effort.  No retractions, lungs c t a  GU: deferred Musculoskeletal: FROM all extremities, warm and well perfused.  Right knee is tender at the joint line.  Neurovascular is intact.  Minimal swelling is noted. Neurologic:  Normal speech and language.  Skin:  Skin is warm, dry and intact. No rash noted. Psychiatric: Mood and affect are normal. Speech and behavior are normal.  ____________________________________________   LABS (all labs ordered are listed, but only abnormal results are displayed)  Labs Reviewed - No data to  display ____________________________________________   ____________________________________________  RADIOLOGY  X-ray of the right knee is negative MRI of the right knee shows a questionable tear to the lateral meniscus of the anterior horn  ____________________________________________   PROCEDURES  Procedure(s) performed: Knee immobilizer  Procedures    ____________________________________________   INITIAL IMPRESSION / ASSESSMENT AND PLAN / ED COURSE  Pertinent labs & imaging results that were available during my care of the patient were reviewed by me and considered in my medical decision making (see chart for details).   Patient is 32 year old female presents emergency department complaint of right knee pain.  Physical exam of the right knee shows tenderness along joint line.  X-ray of the right knee is negative MRI of the right knee shows a anterior horn lateral meniscus tear.  Explained these results to the patient.  She is to follow-up with orthopedics.  She was placed in a knee immobilizer.  Given prescription for meloxicam.  She had been given Toradol IV while here in the ED.  She was discharged in stable condition.     As part of my medical decision making, I reviewed the following data within the electronic MEDICAL RECORD NUMBER Nursing notes reviewed and incorporated, Old chart reviewed, Radiograph reviewed x-rays as noted above, Notes from prior ED visits and [place] Controlled Substance Database  ____________________________________________   FINAL CLINICAL IMPRESSION(S) / ED DIAGNOSES  Final diagnoses:  Acute lateral meniscus tear of right knee, initial encounter      NEW MEDICATIONS STARTED DURING THIS VISIT:  New Prescriptions   MELOXICAM (MOBIC) 15 MG TABLET    Take 1 tablet (15 mg total) by mouth daily.     Note:  This document was prepared using Dragon voice recognition software and may include unintentional dictation errors.    Faythe Ghee, PA-C 10/02/18 2305    Emily Filbert, MD 10/02/18 [phone]

## 2018-10-02 NOTE — ED Triage Notes (Signed)
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

## 2018-10-02 NOTE — ED Notes (Signed)
Pt on the phone with MRI being screened.

## 2018-10-02 NOTE — ED Notes (Signed)
See triage note  Presents with pain to right knee  Thinks she may have twisted her right while she was rushing  Developed pain to lateral aspect on right knee and shoots into lower leg

## 2018-10-21 ENCOUNTER — Ambulatory Visit: Payer: No Typology Code available for payment source | Admitting: Nurse Practitioner

## 2019-02-09 ENCOUNTER — Emergency Department (HOSPITAL_COMMUNITY): Payer: No Typology Code available for payment source

## 2019-02-09 ENCOUNTER — Encounter (HOSPITAL_COMMUNITY): Payer: Self-pay | Admitting: Emergency Medicine

## 2019-02-09 ENCOUNTER — Emergency Department (HOSPITAL_COMMUNITY)
Admission: EM | Admit: 2019-02-09 | Discharge: 2019-02-09 | Disposition: A | Discharge status: Home / Self Care | Payer: No Typology Code available for payment source | Attending: Emergency Medicine | Admitting: Emergency Medicine

## 2019-02-09 DIAGNOSIS — Y92410 Unspecified street and highway as the place of occurrence of the external cause: Secondary | ICD-10-CM | POA: Diagnosis not present

## 2019-02-09 DIAGNOSIS — S0990XA Unspecified injury of head, initial encounter: Secondary | ICD-10-CM | POA: Insufficient documentation

## 2019-02-09 DIAGNOSIS — Y9389 Activity, other specified: Secondary | ICD-10-CM | POA: Insufficient documentation

## 2019-02-09 DIAGNOSIS — Z87891 Personal history of nicotine dependence: Secondary | ICD-10-CM | POA: Insufficient documentation

## 2019-02-09 DIAGNOSIS — Y998 Other external cause status: Secondary | ICD-10-CM | POA: Insufficient documentation

## 2019-02-09 LAB — CBC WITH DIFFERENTIAL/PLATELET
Abs Immature Granulocytes: 0.01 10*3/uL (ref 0.00–0.07)
BASOS ABS: 0 10*3/uL (ref 0.0–0.1)
Basophils Relative: 1 %
EOS PCT: 2 %
Eosinophils Absolute: 0.1 10*3/uL (ref 0.0–0.5)
HCT: 32.2 % — ABNORMAL LOW (ref 36.0–46.0)
Hemoglobin: 10.2 g/dL — ABNORMAL LOW (ref 12.0–15.0)
Immature Granulocytes: 0 %
LYMPHS PCT: 36 %
Lymphs Abs: 1.5 10*3/uL (ref 0.7–4.0)
MCH: 29 pg (ref 26.0–34.0)
MCHC: 31.7 g/dL (ref 30.0–36.0)
MCV: 91.5 fL (ref 80.0–100.0)
Monocytes Absolute: 0.4 10*3/uL (ref 0.1–1.0)
Monocytes Relative: 9 %
Neutro Abs: 2.2 10*3/uL (ref 1.7–7.7)
Neutrophils Relative %: 52 %
Platelets: 348 10*3/uL (ref 150–400)
RBC: 3.52 MIL/uL — ABNORMAL LOW (ref 3.87–5.11)
RDW: 13.4 % (ref 11.5–15.5)
WBC: 4.1 10*3/uL (ref 4.0–10.5)
nRBC: 0 % (ref 0.0–0.2)

## 2019-02-09 LAB — COMPREHENSIVE METABOLIC PANEL
ALT: 53 U/L — ABNORMAL HIGH (ref 0–44)
ANION GAP: 7 (ref 5–15)
AST: 83 U/L — ABNORMAL HIGH (ref 15–41)
Albumin: 3.6 g/dL (ref 3.5–5.0)
Alkaline Phosphatase: 96 U/L (ref 38–126)
BUN: 7 mg/dL (ref 6–20)
CALCIUM: 9.1 mg/dL (ref 8.9–10.3)
CO2: 27 mmol/L (ref 22–32)
Chloride: 103 mmol/L (ref 98–111)
Creatinine, Ser: 0.78 mg/dL (ref 0.44–1.00)
GFR calc Af Amer: >60 mL/min (ref >60)
GFR calc non Af Amer: >60 mL/min (ref >60)
Glucose, Bld: 102 mg/dL — ABNORMAL HIGH (ref 70–99)
Potassium: 4.5 mmol/L (ref 3.5–5.1)
Sodium: 137 mmol/L (ref 135–145)
Total Bilirubin: 0.6 mg/dL (ref 0.3–1.2)
Total Protein: 6.6 g/dL (ref 6.5–8.1)

## 2019-02-09 LAB — I-STAT BETA HCG BLOOD, ED (MC, WL, AP ONLY)

## 2019-02-09 MED ORDER — IOHEXOL 300 MG/ML  SOLN
100.0000 mL | Freq: Once | INTRAMUSCULAR | Status: AC | PRN
  Administered 2019-02-09: 100 mL via INTRAVENOUS

## 2019-02-09 MED ORDER — HYDROCODONE-ACETAMINOPHEN 5-325 MG PO TABS: 1.0000 | ORAL_TABLET | Freq: Four times a day (QID) | ORAL | 0 refills | Status: DC | PRN

## 2019-02-09 MED ORDER — ONDANSETRON HCL 4 MG/2ML IJ SOLN
4.0000 mg | Freq: Once | INTRAMUSCULAR | Status: AC
  Administered 2019-02-09: 4 mg via INTRAVENOUS
  Filled 2019-02-09: qty 2

## 2019-02-09 MED ORDER — IBUPROFEN 800 MG PO TABS: 800.0000 mg | ORAL_TABLET | Freq: Three times a day (TID) | ORAL | 0 refills | Status: DC

## 2019-02-09 MED ORDER — MORPHINE SULFATE (PF) 2 MG/ML IV SOLN
2.0000 mg | Freq: Once | INTRAVENOUS | Status: AC
  Administered 2019-02-09: 2 mg via INTRAVENOUS
  Filled 2019-02-09: qty 1

## 2019-02-09 MED ORDER — IBUPROFEN 800 MG PO TABS: 800.0000 mg | ORAL_TABLET | Freq: Three times a day (TID) | ORAL | 0 refills | Status: DC | PRN

## 2019-02-09 NOTE — Discharge Instructions (Signed)
Follow up with your md later this week. °
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
## 2019-02-09 NOTE — ED Notes (Signed)
Patient verbalized understanding on plan of care /prescriptions.

## 2019-02-09 NOTE — ED Notes (Signed)
EDP at bedside evaluating pt.  

## 2019-02-09 NOTE — ED Notes (Signed)
Assisted EDP to remove LSB .

## 2019-07-10 IMAGING — CR DG KNEE COMPLETE 4+V*R*
4 series · 4 of 4 positions shown · non-contrast
Comparison: 05/31/2018

CLINICAL DATA: Pain after dog bite 1 month ago.

EXAM:
RIGHT KNEE - COMPLETE 4+ VIEW

[knee ap]
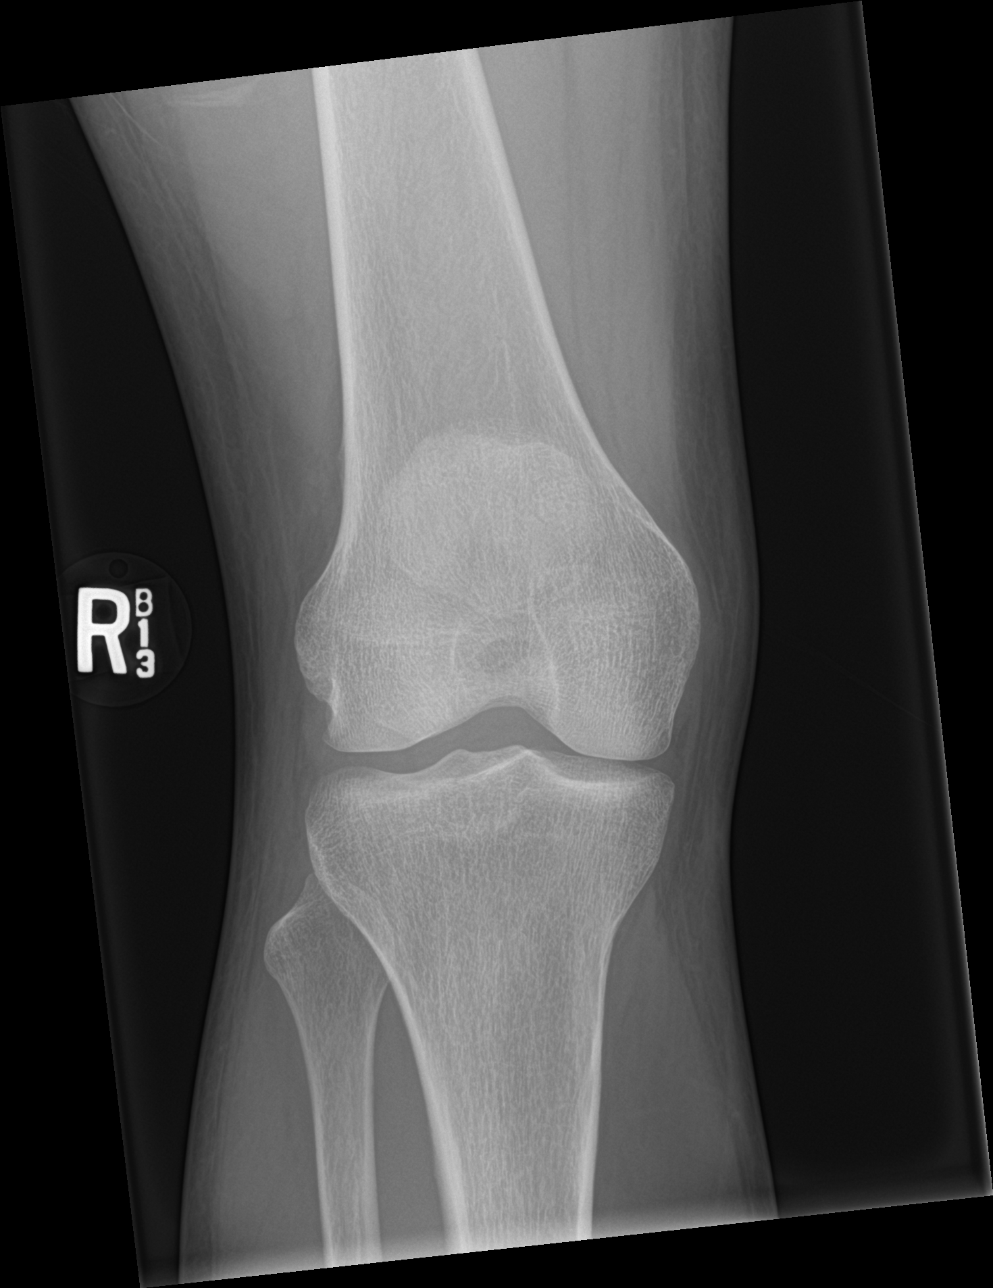

[knee obl (1 of 2)]
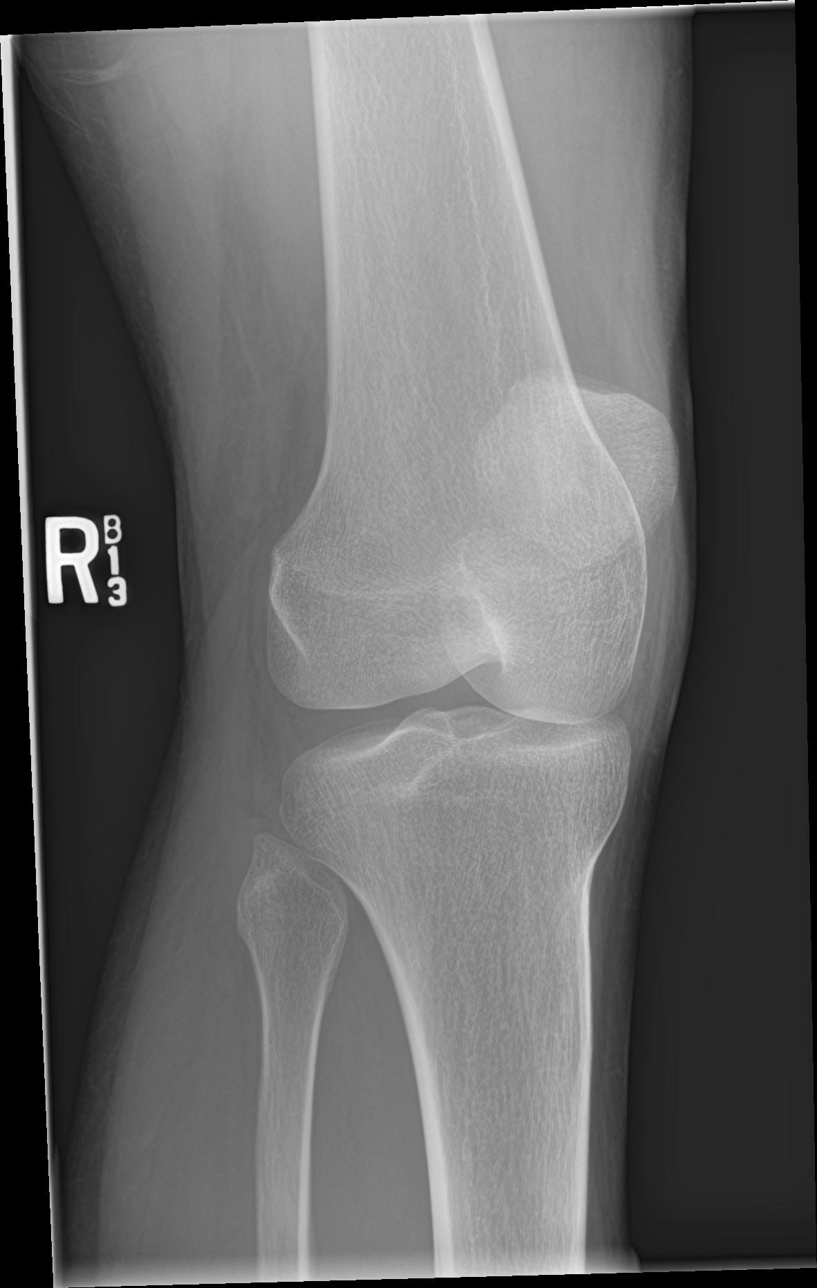

[knee obl (2 of 2)]
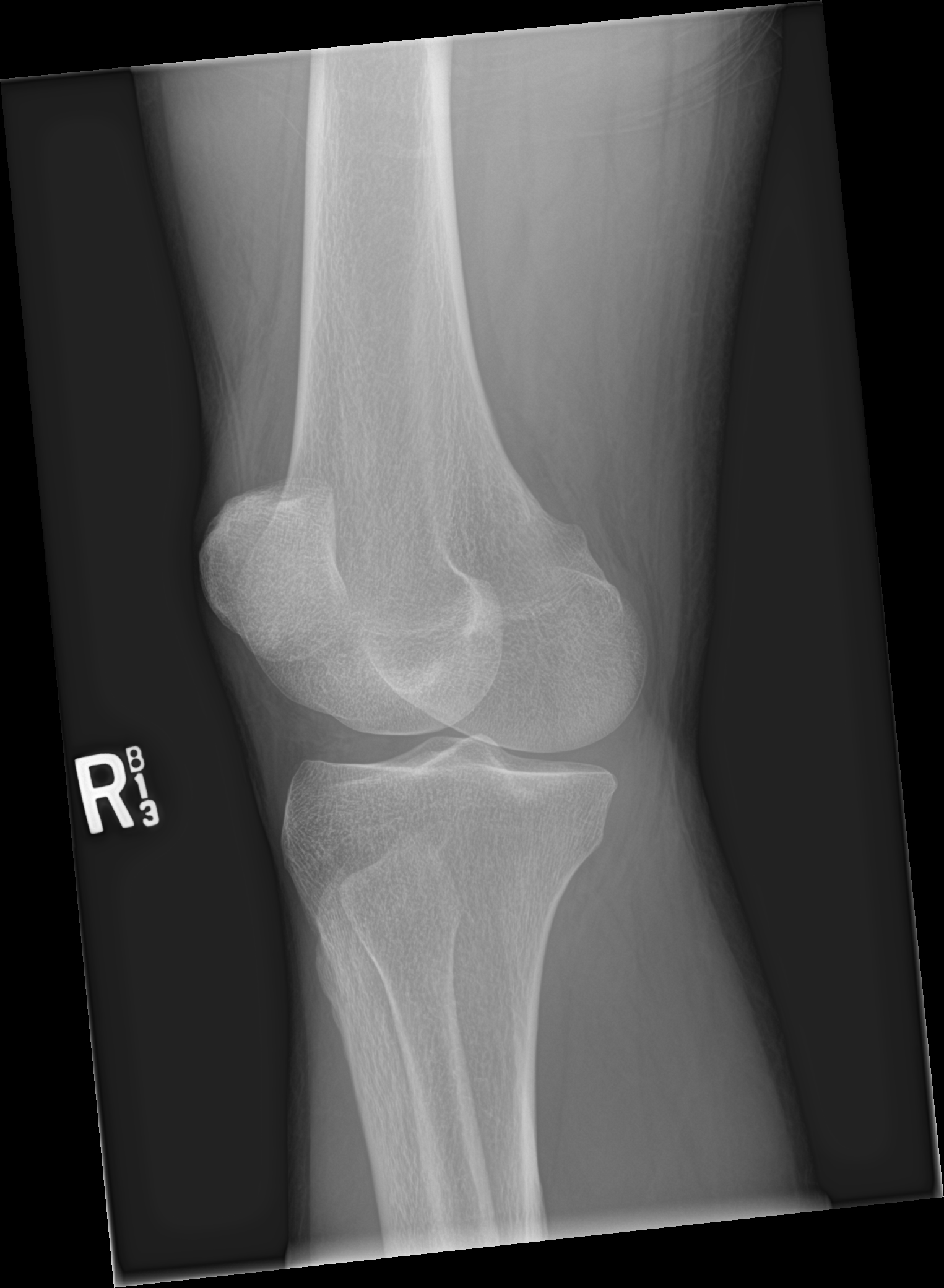

[knee lat]
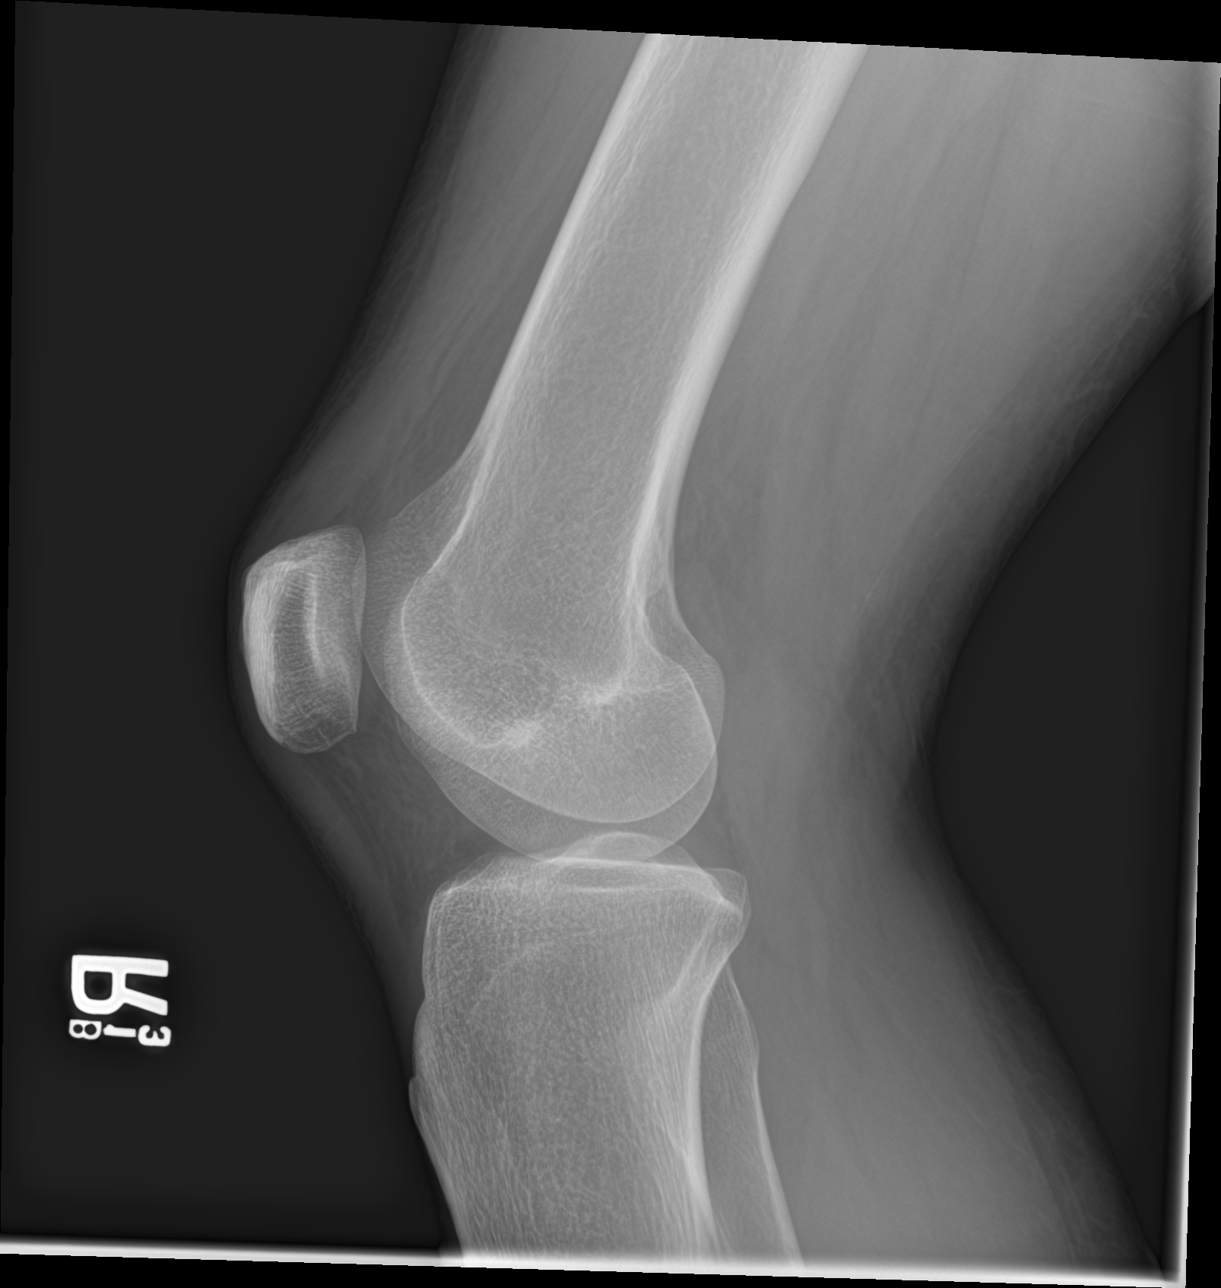

[4 of 4 positions shown; findings below may reference images not displayed]

FINDINGS: No evidence of fracture, dislocation, or joint effusion. No evidence
of arthropathy or other focal bone abnormality. Soft tissues are
unremarkable.
IMPRESSION: Negative.

## 2019-07-10 IMAGING — MR MR KNEE*R* W/O CM
6 series · 40 of 40 positions shown · non-contrast
Comparison: Radiographs 10/02/2018.

CLINICAL DATA: Right knee pain and limited range of motion. Dog
bite 05/31/2018.

EXAM:
MRI OF THE RIGHT KNEE WITHOUT CONTRAST
TECHNIQUE: Multiplanar, multisequence MR imaging of the knee was performed. No
intravenous contrast was administered.

[Series 8: T2 fat-sat · axial · right · 4.0mm · 0.50mm/px · z∈[-17,+105]mm · 7 of 26 slices shown (1 of 3)]
[im 1/26]
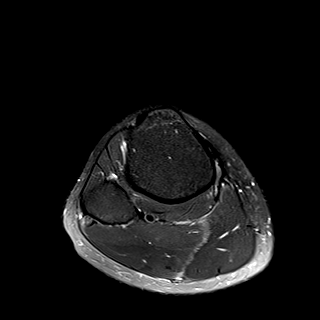
[im 5/26]
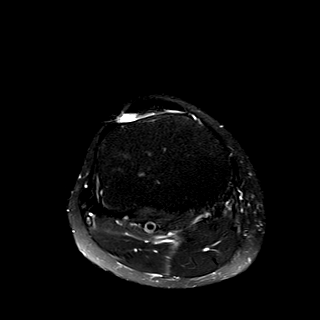
[im 9/26]
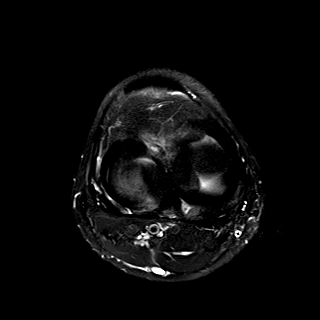
[im 13/26]
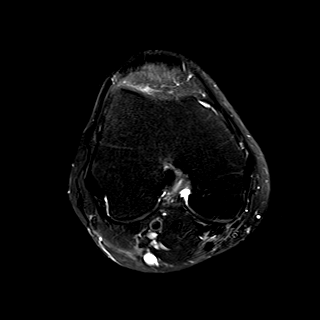
[im 17/26]
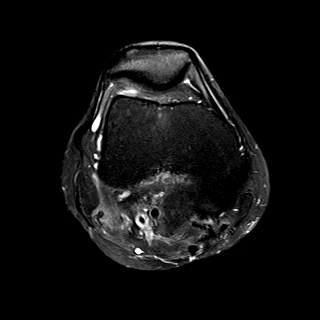
[im 21/26]
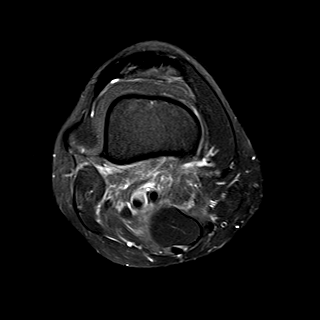
[im 26/26]
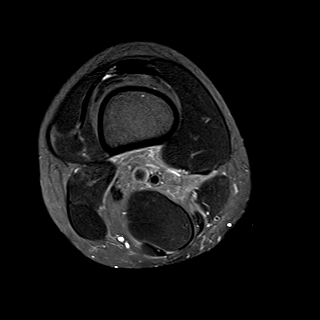

[Series 9: T1 · coronal · right · 4.0mm · 0.59mm/px · 7 of 26 slices shown]
[im 1/26]
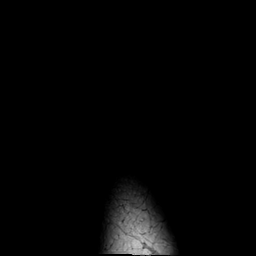
[im 5/26]
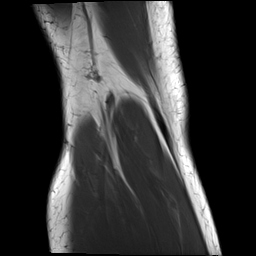
[im 9/26]
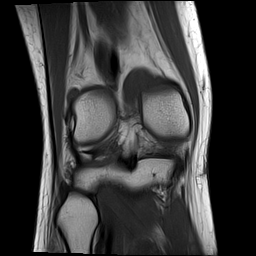
[im 13/26]
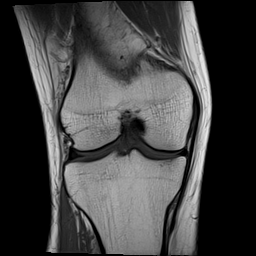
[im 17/26]
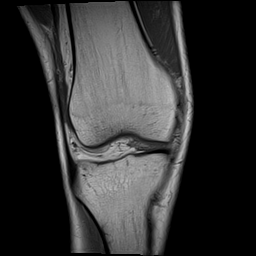
[im 21/26]
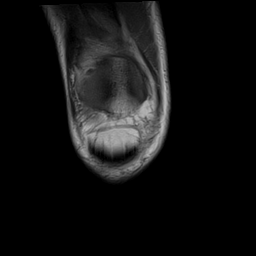
[im 26/26]
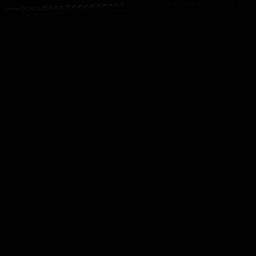

[Series 11: PD fat-sat · coronal · right · 4.0mm · 0.59mm/px · 6 of 24 slices shown (1 of 2)]
[im 1/24]
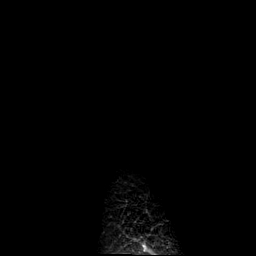
[im 5/24]
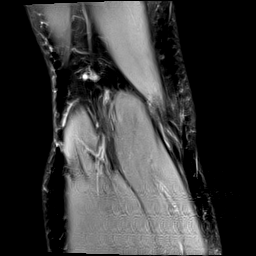
[im 10/24]
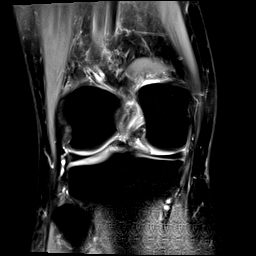
[im 14/24]
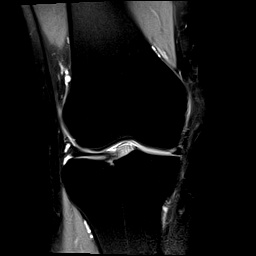
[im 19/24]
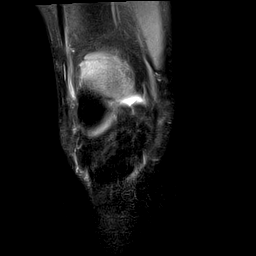
[im 24/24]
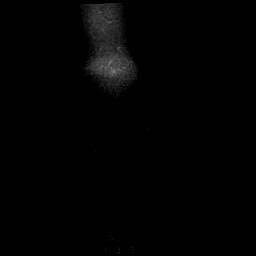

[Series 12: PD fat-sat · sagittal · right · 3.0mm · 0.59mm/px · 7 of 29 slices shown (2 of 2)]
[im 1/29]
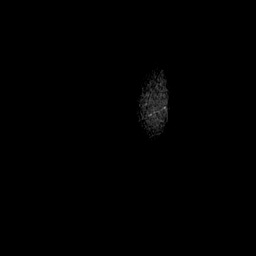
[im 5/29]
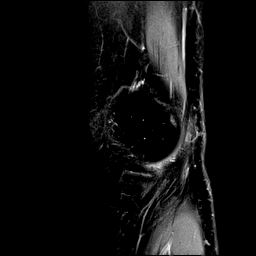
[im 10/29]
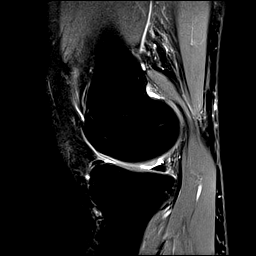
[im 15/29]
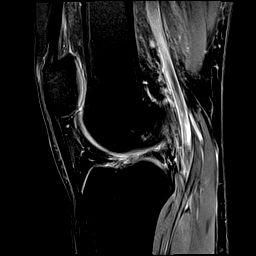
[im 19/29]
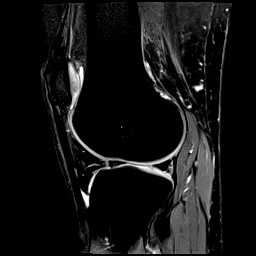
[im 24/29]
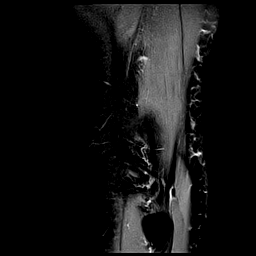
[im 29/29]
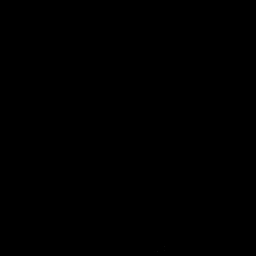

[Series 13: T2 fat-sat · coronal · right · 4.0mm · 0.59mm/px · 6 of 26 slices shown (2 of 3)]
[im 1/26]
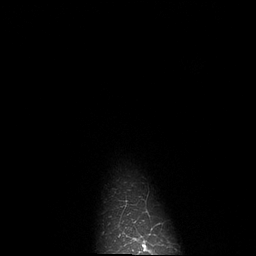
[im 6/26]
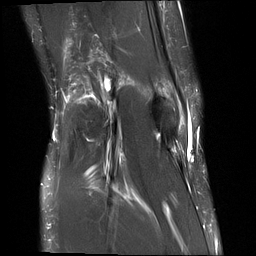
[im 11/26]
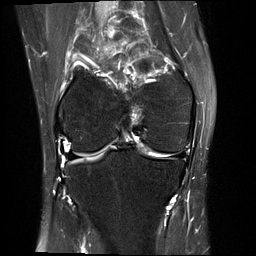
[im 16/26]
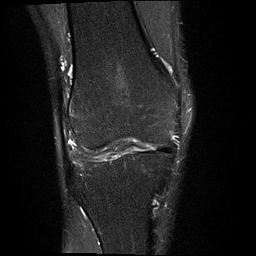
[im 21/26]
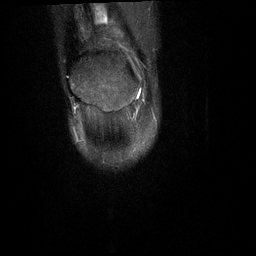
[im 26/26]
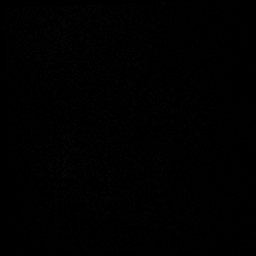

[Series 14: T2 fat-sat · sagittal · right · 3.0mm · 0.59mm/px · 7 of 30 slices shown (3 of 3)]
[im 1/30]
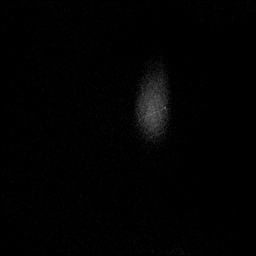
[im 5/30]
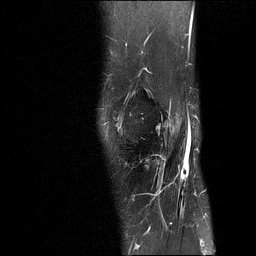
[im 10/30]
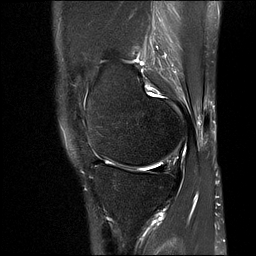
[im 15/30]
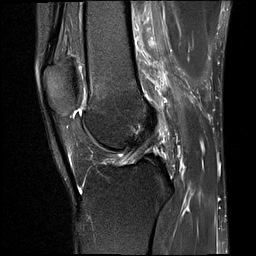
[im 20/30]
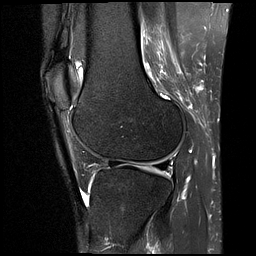
[im 25/30]
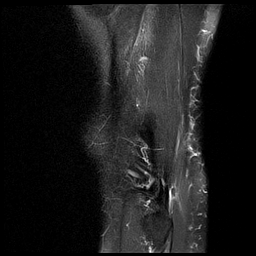
[im 30/30]
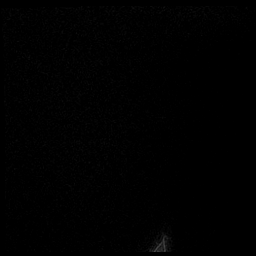

[40 of 40 positions shown; findings below may reference images not displayed]

FINDINGS: MENISCI

Medial meniscus:  Intact with normal morphology.

Lateral meniscus:  Intact with normal morphology.

LIGAMENTS

Cruciates:  Intact.

Collaterals:  Intact.

CARTILAGE

Patellofemoral:  Preserved.

Medial: There is chondral thinning and surface irregularity along
the posterior aspect of the medial femoral condyle without
subchondral signal abnormality.

Lateral:  Preserved.

MISCELLANEOUS

Joint:  No significant joint effusion.

Popliteal Fossa: There is mild edema in the popliteal fossa without
focal fluid collection or significant Baker's cyst.

Extensor Mechanism:  Intact.

Bones: No acute or significant extra-articular osseous findings. No
evidence of osteomyelitis.

Other: There is trace fluid in the deep infrapatellar bursa. No
focal periarticular fluid collections are identified.
IMPRESSION: 1. No evidence of right knee infection related to previous dog bite.
2. No evidence of internal derangement. The menisci, cruciate and
collateral ligaments are intact.
3. Mild chondral thinning along the posterior aspect of the medial
femoral condyle. No acute osseous findings.

## 2022-06-14 ENCOUNTER — Encounter: Payer: Self-pay | Admitting: Emergency Medicine

## 2022-06-14 ENCOUNTER — Other Ambulatory Visit: Payer: Self-pay

## 2022-06-14 ENCOUNTER — Emergency Department
Admission: EM | Admit: 2022-06-14 | Discharge: 2022-06-14 | Disposition: A | Discharge status: Home / Self Care | Attending: Emergency Medicine | Admitting: Emergency Medicine

## 2022-06-14 DIAGNOSIS — E860 Dehydration: Secondary | ICD-10-CM | POA: Insufficient documentation

## 2022-06-14 DIAGNOSIS — T679XXA Effect of heat and light, unspecified, initial encounter: Secondary | ICD-10-CM | POA: Insufficient documentation

## 2022-06-14 DIAGNOSIS — R531 Weakness: Secondary | ICD-10-CM | POA: Diagnosis present

## 2022-06-14 DIAGNOSIS — E876 Hypokalemia: Secondary | ICD-10-CM | POA: Diagnosis not present

## 2022-06-14 LAB — CBC
HCT: 33.3 % — ABNORMAL LOW (ref 36.0–46.0)
Hemoglobin: 10.9 g/dL — ABNORMAL LOW (ref 12.0–15.0)
MCH: 30 pg (ref 26.0–34.0)
MCHC: 32.7 g/dL (ref 30.0–36.0)
MCV: 91.7 fL (ref 80.0–100.0)
Platelets: 286 10*3/uL (ref 150–400)
RBC: 3.63 MIL/uL — ABNORMAL LOW (ref 3.87–5.11)
RDW: 12.7 % (ref 11.5–15.5)
WBC: 7.5 10*3/uL (ref 4.0–10.5)
nRBC: 0 % (ref 0.0–0.2)

## 2022-06-14 LAB — BASIC METABOLIC PANEL
Anion gap: 7 (ref 5–15)
BUN: 8 mg/dL (ref 6–20)
CO2: 21 mmol/L — ABNORMAL LOW (ref 22–32)
Calcium: 8.4 mg/dL — ABNORMAL LOW (ref 8.9–10.3)
Chloride: 109 mmol/L (ref 98–111)
Creatinine, Ser: 0.72 mg/dL (ref 0.44–1.00)
GFR, Estimated: >60 mL/min (ref >60)
Glucose, Bld: 103 mg/dL — ABNORMAL HIGH (ref 70–99)
Potassium: 3.3 mmol/L — ABNORMAL LOW (ref 3.5–5.1)
Sodium: 137 mmol/L (ref 135–145)

## 2022-06-14 LAB — HEPATIC FUNCTION PANEL
ALT: 13 U/L (ref 0–44)
AST: 23 U/L (ref 15–41)
Albumin: 3.6 g/dL (ref 3.5–5.0)
Alkaline Phosphatase: 56 U/L (ref 38–126)
Bilirubin, Direct: 0.2 mg/dL (ref 0.0–0.2)
Indirect Bilirubin: 0.9 mg/dL (ref 0.3–0.9)
Total Bilirubin: 1.1 mg/dL (ref 0.3–1.2)
Total Protein: 6.5 g/dL (ref 6.5–8.1)

## 2022-06-14 LAB — LIPASE, BLOOD: Lipase: 28 U/L (ref 11–51)

## 2022-06-14 LAB — MAGNESIUM: Magnesium: 1.7 mg/dL (ref 1.7–2.4)

## 2022-06-14 LAB — HCG, QUANTITATIVE, PREGNANCY: hCG, Beta Chain, Quant, S: <1 m[IU]/mL (ref <5)

## 2022-06-14 MED ORDER — LACTATED RINGERS IV BOLUS
2000.0000 mL | Freq: Once | INTRAVENOUS | Status: AC
  Administered 2022-06-14: 2000 mL via INTRAVENOUS

## 2022-06-14 MED ORDER — POTASSIUM CHLORIDE CRYS ER 20 MEQ PO TBCR
40.0000 meq | EXTENDED_RELEASE_TABLET | Freq: Once | ORAL | Status: AC
  Administered 2022-06-14: 40 meq via ORAL
  Filled 2022-06-14: qty 2

## 2022-06-14 MED ORDER — ACETAMINOPHEN 500 MG PO TABS
1000.0000 mg | ORAL_TABLET | Freq: Once | ORAL | Status: AC
  Administered 2022-06-14: 1000 mg via ORAL
  Filled 2022-06-14: qty 2

## 2022-06-14 NOTE — ED Provider Notes (Signed)
[facility] Provider Note    Event Date/Time   First MD Initiated Contact with Patient 06/14/22 2157     (approximate)   History   Weakness   HPI  Ashley Joseph is a 36 y.o. female without significant past medical history who presents for evaluation of several episodes of vomiting as well as some dizziness and headache that she states started today.  Patient reports she works for [employer] and is worried she got overheated because she was working outside all day.  States she thought she had been drinking of fluids but is not sure.  She also states she missed her last menstrual period and wants to make sure she is not pregnant.  She denies any abnormal bleeding, discharge, burning with urination, abdominal pain, chest pain, cough, shortness of breath, diarrhea, rash or any falls or injuries.  She denies any tobacco abuse EtOH use or illicit drug use.  She did receive 4 mg of Zofran and 500 cc of normal saline prior to arrival.    Past Medical History:  Diagnosis Date   No known health problems      Physical Exam  Triage Vital Signs: ED Triage Vitals  Enc Vitals Group     BP 06/14/22 1719 99/73     Pulse Rate 06/14/22 1719 87     Resp 06/14/22 1719 16     Temp 06/14/22 1719 98.4 F (36.9 C)     Temp Source 06/14/22 1719 Oral     SpO2 06/14/22 1719 99 %     Weight 06/14/22 1714 129 lb (58.5 kg)     Height 06/14/22 1714 5\' 5"  (1.651 m)     Head Circumference --      Peak Flow --      Pain Score 06/14/22 1714 0     Pain Loc --      Pain Edu? --      Excl. in GC? --     Most recent vital signs: Vitals:   06/14/22 1719 06/14/22 2231  BP: 99/73 105/73  Pulse: 87 70  Resp: 16 18  Temp: 98.4 F (36.9 C) 98.5 F (36.9 C)  SpO2: 99% 99%    General: Awake, no distress.  CV:  .  2+ radial pulses.  No murmur.  Decreased capillary fill in the digits. Resp:  Normal effort.  Clear bilaterally. Abd:  No distention.  Soft  throughout Other:  Patient is awake and alert.  PERRLA.  EOMI.  She is moving all extremity spontaneously.  Ears and oropharynx unremarkable.   ED Results / Procedures / Treatments  Labs (all labs ordered are listed, but only abnormal results are displayed) Labs Reviewed  BASIC METABOLIC PANEL - Abnormal; Notable for the following components:      Result Value   Potassium 3.3 (*)    CO2 21 (*)    Glucose, Bld 103 (*)    Calcium 8.4 (*)    All other components within normal limits  CBC - Abnormal; Notable for the following components:   RBC 3.63 (*)    Hemoglobin 10.9 (*)    HCT 33.3 (*)    All other components within normal limits  HEPATIC FUNCTION PANEL  HCG, QUANTITATIVE, PREGNANCY  MAGNESIUM  LIPASE, BLOOD  URINALYSIS, COMPLETE (UACMP) WITH MICROSCOPIC  CBG MONITORING, ED     EKG  EKG is remarkable for sinus rhythm with a ventricular rate of 76, normal axis, unremarkable intervals without clear evidence of acute  ischemia or significant arrhythmia.  There is an isolated nonspecific change in V3.   RADIOLOGY     PROCEDURES:  Critical Care performed: No  Procedures  The patient is on the cardiac monitor to evaluate for evidence of arrhythmia and/or significant heart rate changes.   MEDICATIONS ORDERED IN ED: Medications  lactated ringers bolus 2,000 mL (2,000 mLs Intravenous New Bag/Given 06/14/22 2216)  acetaminophen (TYLENOL) tablet 1,000 mg (1,000 mg Oral Given 06/14/22 2215)  potassium chloride SA (KLOR-CON M) CR tablet 40 mEq (40 mEq Oral Given 06/14/22 2215)     IMPRESSION / MDM / ASSESSMENT AND PLAN / ED COURSE  I reviewed the triage vital signs and the nursing notes. Patient's presentation is most consistent with acute presentation with potential threat to life or bodily function.                               Differential diagnosis includes, but is not limited to heat exposure and dehydration, arrhythmia, anemia, electrolyte derangements, acute  infectious gastritis or cystitis.  I suspect her headache may be related to some dehydration as well.  She has no focal deficits to suggest stroke.  ECG without evidence of acute ischemia or arrhythmia.  hCG is negative.  BMP is shows a K of 3.3 without any other significant electrolyte or metabolic derangements.  CBC shows no leukocytosis and hemoglobin of 10.9 compared to 10.23 years ago.  Normal platelets.  Exam is 1.7.  Lipase WNL not suggestive of pancreatitis.  Hepatic function panel is unremarkable.  Patient is feeling much better on reassessment with improvement of blood pressure and her headache.  She is continuing denying urinary urinary symptoms and I do not think she needs requires further diagnostic testing including UA.  Discussed staying adequately hydrated with electrolyte-containing beverages and trying to do her best to stay out of the heat.  Discussed returning for any new or worsening of symptoms.  Discharged in stable condition.  Strict and precautions advised and discussed.      FINAL CLINICAL IMPRESSION(S) / ED DIAGNOSES   Final diagnoses:  Dehydration  Heat exposure, initial encounter  Hypokalemia     Rx / DC Orders   ED Discharge Orders     None        Note:  This document was prepared using Dragon voice recognition software and may include unintentional dictation errors.   Gilles Chiquito, MD 06/14/22 [phone]

## 2022-06-14 NOTE — ED Notes (Addendum)
First Nurse Note: Patient to ED via [facility] from work. 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 Patient states she hasn't had a a period in approx 2 months and could possibly be pregnant.  EMS VS: HR 80 O2 100% RA Temp 97.7 BP 110/76  124 cbg  18 RAC 4mg  Zofran 500 mL NS

## 2022-06-14 NOTE — ED Notes (Signed)
Patient provided with additional warm blankets per request.

## 2022-06-14 NOTE — ED Provider Triage Note (Signed)
Emergency Medicine Provider Triage Evaluation Note  Ashley Joseph , a 36 y.o. female  was evaluated in triage.  Pt complains of possible dehydration.  Patient was working outside in the heat, and had dizziness and emesis.  Patient is also concerned she may be pregnant.  No headache, chest pain, shortness of breath.  Review of Systems  Positive: Dizziness, lightheaded, emesis, possible dehydration Negative: Headache, visual changes, chest pain, shortness of breath  Physical Exam  BP 99/73 (BP Location: Left Arm)   Pulse 87   Temp 98.4 F (36.9 C) (Oral)   Resp 16   Ht 5\' 5"  (1.651 m)   Wt 58.5 kg   SpO2 99%   BMI 21.47 kg/m  Gen:   Awake, no distress   Resp:  Normal effort  MSK:   Moves extremities without difficulty  Other:    Medical Decision Making  Medically screening exam initiated at 5:53 PM.  Appropriate orders placed.  Ashley Joseph was informed that the remainder of the evaluation will be completed by another provider, this initial triage assessment does not replace that evaluation, and the importance of remaining in the ED until their evaluation is complete.  Patient presents with possible dehydration, possible pregnancy.  She will have labs at this time.   Donato Heinz, PA-C 06/14/22 1753

## 2022-10-09 ENCOUNTER — Emergency Department
Admission: EM | Admit: 2022-10-09 | Discharge: 2022-10-09 | Disposition: A | Discharge status: Home / Self Care | Payer: Self-pay | Attending: Emergency Medicine | Admitting: Emergency Medicine

## 2022-10-09 ENCOUNTER — Emergency Department: Payer: Self-pay

## 2022-10-09 ENCOUNTER — Other Ambulatory Visit: Payer: Self-pay

## 2022-10-09 DIAGNOSIS — S40012A Contusion of left shoulder, initial encounter: Secondary | ICD-10-CM | POA: Insufficient documentation

## 2022-10-09 DIAGNOSIS — S161XXA Strain of muscle, fascia and tendon at neck level, initial encounter: Secondary | ICD-10-CM | POA: Insufficient documentation

## 2022-10-09 DIAGNOSIS — R519 Headache, unspecified: Secondary | ICD-10-CM | POA: Diagnosis not present

## 2022-10-09 DIAGNOSIS — Y92410 Unspecified street and highway as the place of occurrence of the external cause: Secondary | ICD-10-CM | POA: Diagnosis not present

## 2022-10-09 DIAGNOSIS — S199XXA Unspecified injury of neck, initial encounter: Secondary | ICD-10-CM | POA: Diagnosis present

## 2022-10-09 MED ORDER — METHOCARBAMOL 500 MG PO TABS
1000.0000 mg | ORAL_TABLET | Freq: Once | ORAL | Status: AC
  Administered 2022-10-09: 1000 mg via ORAL
  Filled 2022-10-09: qty 2

## 2022-10-09 MED ORDER — MELOXICAM 7.5 MG PO TABS
15.0000 mg | ORAL_TABLET | Freq: Once | ORAL | Status: AC
  Administered 2022-10-09: 15 mg via ORAL
  Filled 2022-10-09: qty 2

## 2022-10-09 MED ORDER — MELOXICAM 15 MG PO TABS: 15.0000 mg | ORAL_TABLET | Freq: Every day | ORAL | 0 refills | Status: AC

## 2022-10-09 MED ORDER — METHOCARBAMOL 500 MG PO TABS: 500.0000 mg | ORAL_TABLET | Freq: Four times a day (QID) | ORAL | 0 refills | Status: AC

## 2022-10-09 NOTE — ED Notes (Signed)
EKG to EDP K[facility] in person.
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
## 2023-02-07 ENCOUNTER — Ambulatory Visit: Payer: Self-pay

## 2023-02-07 ENCOUNTER — Other Ambulatory Visit: Payer: Self-pay | Admitting: Family Medicine

## 2023-02-07 DIAGNOSIS — M25561 Pain in right knee: Secondary | ICD-10-CM

## 2023-02-07 DIAGNOSIS — M79641 Pain in right hand: Secondary | ICD-10-CM

## 2023-08-23 ENCOUNTER — Ambulatory Visit
Admission: EM | Admit: 2023-08-23 | Discharge: 2023-08-23 | Disposition: A | Discharge status: Home / Self Care | Payer: Self-pay | Attending: Physician Assistant | Admitting: Physician Assistant

## 2023-08-23 DIAGNOSIS — K047 Periapical abscess without sinus: Secondary | ICD-10-CM

## 2023-08-23 MED ORDER — IBUPROFEN 600 MG PO TABS
600.0000 mg | ORAL_TABLET | Freq: Once | ORAL | Status: AC
  Administered 2023-08-23: 600 mg via ORAL

## 2023-08-23 MED ORDER — AMOXICILLIN 500 MG PO CAPS: 500.0000 mg | ORAL_CAPSULE | Freq: Three times a day (TID) | ORAL | 0 refills | Status: AC

## 2023-08-23 NOTE — ED Triage Notes (Signed)
"  Starting last night with Dental Pain on bottom right in front and back, its hard to move this side of my mouth". No fever.

## 2023-08-23 NOTE — ED Provider Notes (Signed)
[facility]    CSN: [id number] Arrival date & time: 08/23/23  1914      History   Chief Complaint Chief Complaint  Patient presents with   Dental Pain    HPI Krisandra Bueno is a 37 y.o. female.   Patient here today for dental pain that started last night.  She reports that she has had dental pain for a while but worsened last night with swelling to left side of her face.  She states that she has not had any fever but does have low-grade fever in office today.  She has taken ibuprofen with minimal relief.  She does not currently have a dentist that she is awaiting insurance.  The history is provided by the patient.  Dental Pain Associated symptoms: facial swelling   Associated symptoms: no fever     Past Medical History:  Diagnosis Date   No known health problems     Patient Active Problem List   Diagnosis Date Noted   Chronic pain of left knee 01/02/2018   History of smoking 12/10/2017    Past Surgical History:  Procedure Laterality Date   none      OB History   No obstetric history on file.      Home Medications    Prior to Admission medications   Medication Sig Start Date End Date Taking? Authorizing Provider  amoxicillin (AMOXIL) 500 MG capsule Take 1 capsule (500 mg total) by mouth 3 (three) times daily. 08/23/23  Yes Tomi Bamberger, PA-C  ibuprofen (ADVIL,MOTRIN) 800 MG tablet Take 1 tablet (800 mg total) by mouth 3 (three) times daily. Patient taking differently: Take 800 mg by mouth 3 (three) times daily. Last taken: 0600 am. 02/09/19  Yes Bethann Berkshire, MD  acetaminophen (TYLENOL) 500 MG tablet Take 1,000 mg by mouth every 6 (six) hours as needed for mild pain or headache.    [provider]  meloxicam (MOBIC) 15 MG tablet Take 1 tablet (15 mg total) by mouth daily. 10/09/22 10/09/23  Cuthriell, Delorise Royals, PA-C  methocarbamol (ROBAXIN) 500 MG tablet Take 1 tablet (500 mg total) by mouth 4 (four) times daily. 10/09/22    Cuthriell, Delorise Royals, PA-C    Family History Family History  Problem Relation Age of Onset   Stroke Mother    Hypertension Mother    Congestive Heart Failure Mother    Heart failure Mother    Hypertension Father    ALS Father    Diabetes Maternal Aunt    Congestive Heart Failure Maternal Grandmother    Stroke Maternal Grandmother    Healthy Sister    Other Brother        surgical removal of tumor affecting optic nerve   Healthy Sister    Healthy Brother     Social History Social History   Tobacco Use   Smoking status: Former    Types: Cigars    Quit date: 02/2017    Years since quitting: 6.4   Smokeless tobacco: Never   Tobacco comments:    Black mild 5-6 per day  Vaping Use   Vaping status: Never Used  Substance Use Topics   Alcohol use: Yes    Comment: socially   Drug use: No     Allergies   Patient has no known allergies.   Review of Systems Review of Systems  Constitutional:  Negative for chills and fever.  HENT:  Positive for dental problem and facial swelling.   Eyes:  Negative for  discharge and redness.  Respiratory:  Negative for shortness of breath.   Gastrointestinal:  Negative for nausea and vomiting.     Physical Exam Triage Vital Signs ED Triage Vitals  Encounter Vitals Group     BP 08/23/23 1922 102/67     Systolic BP Percentile --      Diastolic BP Percentile --      Pulse Rate 08/23/23 1922 79     Resp 08/23/23 1922 18     Temp 08/23/23 1922 99.8 F (37.7 C)     Temp Source 08/23/23 1922 Oral     SpO2 08/23/23 1922 97 %     Weight 08/23/23 1920 114 lb (51.7 kg)     Height 08/23/23 1920 5\' 5"  (1.651 m)     Head Circumference --      Peak Flow --      Pain Score 08/23/23 1916 10     Pain Loc --      Pain Education --      Exclude from Growth Chart --    No data found.  Updated Vital Signs BP 102/67 (BP Location: Right Arm)   Pulse 79   Temp 99.8 F (37.7 C) (Oral)   Resp 18   Ht 5\' 5"  (1.651 m)   Wt 114 lb (51.7 kg)    LMP 08/17/2023 (Exact Date)   SpO2 97%   BMI 18.97 kg/m      Physical Exam Vitals and nursing note reviewed.  Constitutional:      General: She is not in acute distress.    Appearance: Normal appearance. She is not ill-appearing.  HENT:     Head: Normocephalic and atraumatic.     Mouth/Throat:     Comments: Poor dentition throughout, gingival swelling diffuse, mild swelling to left mandibular area Eyes:     Conjunctiva/sclera: Conjunctivae normal.  Cardiovascular:     Rate and Rhythm: Normal rate.  Pulmonary:     Effort: Pulmonary effort is normal. No respiratory distress.  Neurological:     Mental Status: She is alert.  Psychiatric:        Mood and Affect: Mood normal.        Behavior: Behavior normal.        Thought Content: Thought content normal.      UC Treatments / Results  Labs (all labs ordered are listed, but only abnormal results are displayed) Labs Reviewed - No data to display  EKG   Radiology No results found.  Procedures Procedures (including critical care time)  Medications Ordered in UC Medications  ibuprofen (ADVIL) tablet 600 mg (600 mg Oral Given 08/23/23 1938)    Initial Impression / Assessment and Plan / UC Course  I have reviewed the triage vital signs and the nursing notes.  Pertinent labs & imaging results that were available during my care of the patient were reviewed by me and considered in my medical decision making (see chart for details).    Amoxicillin prescribed and ibuprofen administered in office.  Discussed ibuprofen and Tylenol at home for pain relief if needed.  Ultimately advised patient to see a dentist and resources provided for self-pay patients.  Encouraged follow-up if symptoms fail to improve or evaluation in the emergency room with any worsening.  Patient expresses understanding.  Final Clinical Impressions(s) / UC Diagnoses   Final diagnoses:  Dental abscess   Discharge Instructions   None    ED  Prescriptions     Medication Sig Dispense Auth. Provider  amoxicillin (AMOXIL) 500 MG capsule Take 1 capsule (500 mg total) by mouth 3 (three) times daily. 21 capsule Tomi Bamberger, PA-C      PDMP not reviewed this encounter.   Tomi Bamberger, PA-C 08/23/23 1948

## 2024-03-31 ENCOUNTER — Emergency Department: Payer: Self-pay

## 2024-03-31 ENCOUNTER — Emergency Department
Admission: EM | Admit: 2024-03-31 | Discharge: 2024-03-31 | Disposition: A | Discharge status: Home / Self Care | Payer: Self-pay | Attending: Emergency Medicine | Admitting: Emergency Medicine

## 2024-03-31 DIAGNOSIS — M5441 Lumbago with sciatica, right side: Secondary | ICD-10-CM | POA: Insufficient documentation

## 2024-03-31 DIAGNOSIS — M5442 Lumbago with sciatica, left side: Secondary | ICD-10-CM | POA: Insufficient documentation

## 2024-03-31 DIAGNOSIS — M5440 Lumbago with sciatica, unspecified side: Secondary | ICD-10-CM

## 2024-03-31 DIAGNOSIS — M62830 Muscle spasm of back: Secondary | ICD-10-CM | POA: Insufficient documentation

## 2024-03-31 LAB — URINALYSIS, ROUTINE W REFLEX MICROSCOPIC
Bilirubin Urine: NEGATIVE
Glucose, UA: NEGATIVE mg/dL
Hgb urine dipstick: NEGATIVE
Ketones, ur: NEGATIVE mg/dL
Leukocytes,Ua: NEGATIVE
Nitrite: NEGATIVE
Protein, ur: NEGATIVE mg/dL
Specific Gravity, Urine: 1.01 (ref 1.005–1.030)
pH: 7 (ref 5.0–8.0)

## 2024-03-31 LAB — POC URINE PREG, ED: Preg Test, Ur: NEGATIVE

## 2024-03-31 MED ORDER — PREDNISONE 50 MG PO TABS: ORAL_TABLET | ORAL | 0 refills | Status: AC

## 2024-03-31 MED ORDER — CYCLOBENZAPRINE HCL 10 MG PO TABS: 10.0000 mg | ORAL_TABLET | Freq: Three times a day (TID) | ORAL | 0 refills | Status: AC | PRN

## 2024-03-31 MED ORDER — OXYCODONE HCL 5 MG PO TABS
5.0000 mg | ORAL_TABLET | Freq: Once | ORAL | Status: AC
  Administered 2024-03-31: 5 mg via ORAL
  Filled 2024-03-31: qty 1

## 2024-03-31 MED ORDER — NAPROXEN 500 MG PO TBEC: 500.0000 mg | DELAYED_RELEASE_TABLET | Freq: Two times a day (BID) | ORAL | 0 refills | Status: AC

## 2024-03-31 MED ORDER — CYCLOBENZAPRINE HCL 10 MG PO TABS
5.0000 mg | ORAL_TABLET | Freq: Once | ORAL | Status: AC
  Administered 2024-03-31: 5 mg via ORAL
  Filled 2024-03-31: qty 1

## 2024-03-31 MED ORDER — LIDOCAINE 5 % EX PTCH: 1.0000 | MEDICATED_PATCH | CUTANEOUS | 0 refills | Status: AC

## 2024-03-31 MED ORDER — LIDOCAINE 5 % EX PTCH
1.0000 | MEDICATED_PATCH | CUTANEOUS | Status: DC
  Administered 2024-03-31: 1 via TRANSDERMAL
  Filled 2024-03-31: qty 1

## 2024-03-31 MED ORDER — ACETAMINOPHEN 500 MG PO TABS
1000.0000 mg | ORAL_TABLET | Freq: Four times a day (QID) | ORAL | Status: AC | PRN
  Administered 2024-03-31: 1000 mg via ORAL
  Filled 2024-03-31: qty 2

## 2024-03-31 MED ORDER — DEXAMETHASONE SODIUM PHOSPHATE 10 MG/ML IJ SOLN
10.0000 mg | Freq: Once | INTRAMUSCULAR | Status: AC
  Administered 2024-03-31: 10 mg via INTRAMUSCULAR
  Filled 2024-03-31: qty 1

## 2024-03-31 NOTE — ED Triage Notes (Signed)
 Pt presents to the ED via POV from home with bilateral lower back pain x4 days, Pt reports picking something heavy up at work and starting to have pain in her back the next morning. Pt denies incontinence or decrease in sensation. Ambulatory to triage room.
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
## 2024-03-31 NOTE — ED Notes (Signed)
 See triage notes. Patient c/o lower back pain that occasionally radiates down the right leg for the past four days. Patient stated she lifted a heavy package at work

## 2024-03-31 NOTE — Discharge Instructions (Addendum)
 You have been diagnosed with acute bilateral low back pain with sciatica, muscle spasm of back.  Please take Flexeril  1 tablet by mouth every 8 hours after main meals, please apply 1 lidocaine patch onto the skin in your back, please discard the patch within 12 hours.  Please take naproxen  1 tablet by mouth every 12 hours with a meal.  Please take prednisone 1 tablet with breakfast.  Please call Dr. Abnormal and make an appointment for a follow-up.  Come back to ED if you have new symptoms or symptoms worsen.  You can call internal medicine to establish care with primary care physician.
